# Patient Record
Sex: Female | Born: 1970 | Race: White | Hispanic: No | Marital: Married | Smoking: Former smoker
Health system: Southern US, Community
[De-identification: ages and names within clinical notes are randomized; demographics above are authoritative.]

## PROBLEM LIST (undated history)

## (undated) DIAGNOSIS — Z87891 Personal history of nicotine dependence: Secondary | ICD-10-CM

## (undated) DIAGNOSIS — K219 Gastro-esophageal reflux disease without esophagitis: Secondary | ICD-10-CM

## (undated) DIAGNOSIS — M1990 Unspecified osteoarthritis, unspecified site: Secondary | ICD-10-CM

## (undated) DIAGNOSIS — R112 Nausea with vomiting, unspecified: Secondary | ICD-10-CM

## (undated) DIAGNOSIS — E785 Hyperlipidemia, unspecified: Secondary | ICD-10-CM

## (undated) HISTORY — PX: ABDOMINOPLASTY: SUR9

## (undated) HISTORY — PX: ANTERIOR CRUCIATE LIGAMENT REPAIR: SHX115

## (undated) HISTORY — PX: BREAST ENHANCEMENT SURGERY: SHX7

## (undated) HISTORY — PX: TUBAL LIGATION: SHX77

## (undated) HISTORY — PX: ROTATOR CUFF REPAIR: SHX139

## (undated) HISTORY — PX: ABDOMINAL HYSTERECTOMY: SHX81

---

## 2009-02-09 ENCOUNTER — Ambulatory Visit (HOSPITAL_BASED_OUTPATIENT_CLINIC_OR_DEPARTMENT_OTHER): Admission: RE | Admit: 2009-02-09 | Discharge: 2009-02-10 | Payer: Self-pay | Admitting: Gynecology

## 2009-04-27 ENCOUNTER — Encounter: Admission: RE | Admit: 2009-04-27 | Discharge: 2009-04-27 | Payer: Self-pay | Admitting: Gynecology

## 2010-01-05 ENCOUNTER — Encounter: Admission: RE | Admit: 2010-01-05 | Discharge: 2010-01-05 | Payer: Self-pay | Admitting: Gynecology

## 2010-08-29 ENCOUNTER — Encounter (INDEPENDENT_AMBULATORY_CARE_PROVIDER_SITE_OTHER): Payer: Self-pay | Admitting: Emergency Medicine

## 2010-08-29 ENCOUNTER — Ambulatory Visit: Payer: Self-pay | Admitting: Vascular Surgery

## 2010-08-29 ENCOUNTER — Emergency Department (HOSPITAL_COMMUNITY): Admission: EM | Admit: 2010-08-29 | Discharge: 2010-08-29 | Payer: Self-pay | Admitting: Emergency Medicine

## 2010-08-29 ENCOUNTER — Ambulatory Visit: Admission: RE | Admit: 2010-08-29 | Discharge: 2010-08-29 | Payer: Self-pay | Admitting: Emergency Medicine

## 2011-01-27 ENCOUNTER — Other Ambulatory Visit: Payer: Self-pay | Admitting: Gynecology

## 2011-01-27 DIAGNOSIS — Z1231 Encounter for screening mammogram for malignant neoplasm of breast: Secondary | ICD-10-CM

## 2011-01-31 ENCOUNTER — Ambulatory Visit
Admission: RE | Admit: 2011-01-31 | Discharge: 2011-01-31 | Disposition: A | Discharge status: Home / Self Care | Payer: BC Managed Care – PPO | Source: Ambulatory Visit | Attending: Gynecology | Admitting: Gynecology

## 2011-01-31 DIAGNOSIS — Z1231 Encounter for screening mammogram for malignant neoplasm of breast: Secondary | ICD-10-CM

## 2011-02-23 LAB — POCT HEMOGLOBIN-HEMACUE: Hemoglobin: 14.7 g/dL (ref 12.0–15.0)

## 2011-03-28 NOTE — Op Note (Signed)
Deanna Gregory, Deanna Gregory                 ACCOUNT NO.:  [id number]   MEDICAL RECORD NO.:  [id number]          PATIENT TYPE:  AMB   LOCATION:  [facility]                         FACILITY:  [facility]   PHYSICIAN:  Gretta Cool, M.D. DATE OF BIRTH:  03/30/1971   DATE OF PROCEDURE:  02/08/2009  DATE OF DISCHARGE:                               OPERATIVE REPORT   PREOPERATIVE DIAGNOSES:  Severe pelvic organ prolapse, grade 3, with  rectocele, enterocele and severe fascial detachment, __________descent.   PROCEDURE:  Posterior and enterocele repairs, cardinal uterosacral  colposuspension.   SURGEON:  Dr. Beather Arbour.   ASSISTANT:  Dr. Ilda Mori.   ANESTHESIA:  General orotracheal.   DESCRIPTION OF PROCEDURE:  Under excellent anesthesia as above with the  patient prepped and draped in lithotomy position in [place] stirrups with  bladder drained by Foley catheter, the introitus was grasped with Kelly  clamps and an incision was made in the posterior perineal body and the  mucosa and skin excised at the introitus.  The mucosa was then  undermined and dissected to the apex of the vaginal cuff.  The cut edges  were grasped with Allis clamps.  The mucosa was then dissected from the  perirectal fascia.  At this point, the large enterocele was identified.  The cardinal uterosacral complex was also identified on each side and  grasped as far posteriorly as possible.  The posterior vaginal fascia  was then secured from its detached position down to the lower third of  the vagina and pulled all the way back up to the cardinal uterosacral  complex.  It was attached with no intervening fat or other tissues.  A 2-  0 Novofil suture was used.  At this point, the fascial defect was  completely repaired except for the central area.  The central fascia was  then secured to the uterosacral cardinal complex remnants laterally and  to the anterior vaginal wall and then to the posterior fascia at the  apex of the  vaginal cuff so as to incorporate a complete envelope of  fascia.  The fascia was then plicated transversely so as to completely  repair the perirectal fascia, small fine defects and weaknesses.  At  this point, the fascia was repaired from the apex of the cuff to the  introitus.  The perineal body muscles were then individually plicated in  the midline so as to rebuild the perineal body.  At this point, the  mucosa was trimmed and the upper layers of endopelvic fascia and mucosa  were closed with a running suture of 2-0 Vicryl.  At this point, the  procedure was terminated without complication.  The patient returned to  the recovery room in excellent condition.  No packs.  Blood loss  estimated at less than 100 mL.  Complications none.           ______________________________  Gretta Cool, M.D.    CWL/MEDQ  D:  02/09/2009  T:  02/09/2009  Job:  [id number]   cc:   Warrick Parisian, MD  [facility]

## 2012-03-22 ENCOUNTER — Other Ambulatory Visit: Payer: Self-pay | Admitting: Family Medicine

## 2012-03-22 DIAGNOSIS — Z1231 Encounter for screening mammogram for malignant neoplasm of breast: Secondary | ICD-10-CM

## 2012-04-05 ENCOUNTER — Ambulatory Visit: Payer: BC Managed Care – PPO

## 2012-07-02 ENCOUNTER — Ambulatory Visit
Admission: RE | Admit: 2012-07-02 | Discharge: 2012-07-02 | Disposition: A | Discharge status: Home / Self Care | Source: Ambulatory Visit | Attending: Family Medicine | Admitting: Family Medicine

## 2012-07-02 DIAGNOSIS — Z1231 Encounter for screening mammogram for malignant neoplasm of breast: Secondary | ICD-10-CM

## 2013-03-14 ENCOUNTER — Ambulatory Visit (HOSPITAL_COMMUNITY)
Admission: RE | Admit: 2013-03-14 | Discharge: 2013-03-14 | Disposition: A | Discharge status: Home / Self Care | Source: Ambulatory Visit | Attending: Family Medicine | Admitting: Family Medicine

## 2013-03-14 ENCOUNTER — Other Ambulatory Visit (HOSPITAL_COMMUNITY): Payer: Self-pay | Admitting: Family Medicine

## 2013-03-14 DIAGNOSIS — R0989 Other specified symptoms and signs involving the circulatory and respiratory systems: Secondary | ICD-10-CM | POA: Insufficient documentation

## 2013-03-14 DIAGNOSIS — R05 Cough: Secondary | ICD-10-CM

## 2013-03-14 DIAGNOSIS — R059 Cough, unspecified: Secondary | ICD-10-CM | POA: Insufficient documentation

## 2015-08-12 ENCOUNTER — Other Ambulatory Visit (HOSPITAL_COMMUNITY): Payer: Self-pay | Admitting: Family Medicine

## 2015-08-12 ENCOUNTER — Ambulatory Visit (HOSPITAL_COMMUNITY)
Admission: RE | Admit: 2015-08-12 | Discharge: 2015-08-12 | Disposition: A | Discharge status: Home / Self Care | Source: Ambulatory Visit | Attending: Family Medicine | Admitting: Family Medicine

## 2015-08-12 DIAGNOSIS — R52 Pain, unspecified: Secondary | ICD-10-CM

## 2015-08-12 DIAGNOSIS — M79671 Pain in right foot: Secondary | ICD-10-CM | POA: Insufficient documentation

## 2015-08-12 DIAGNOSIS — M79672 Pain in left foot: Secondary | ICD-10-CM | POA: Diagnosis not present

## 2017-03-10 IMAGING — CR DG FOOT COMPLETE 3+V*R*
3 series · 3 of 3 positions shown · non-contrast
Comparison: None.

CLINICAL DATA: Bilateral foot pain with burning in the first MTP
joints for 9 months. No acute injury. Initial encounter.

EXAM:
RIGHT FOOT COMPLETE - 3+ VIEW

[foot ap]
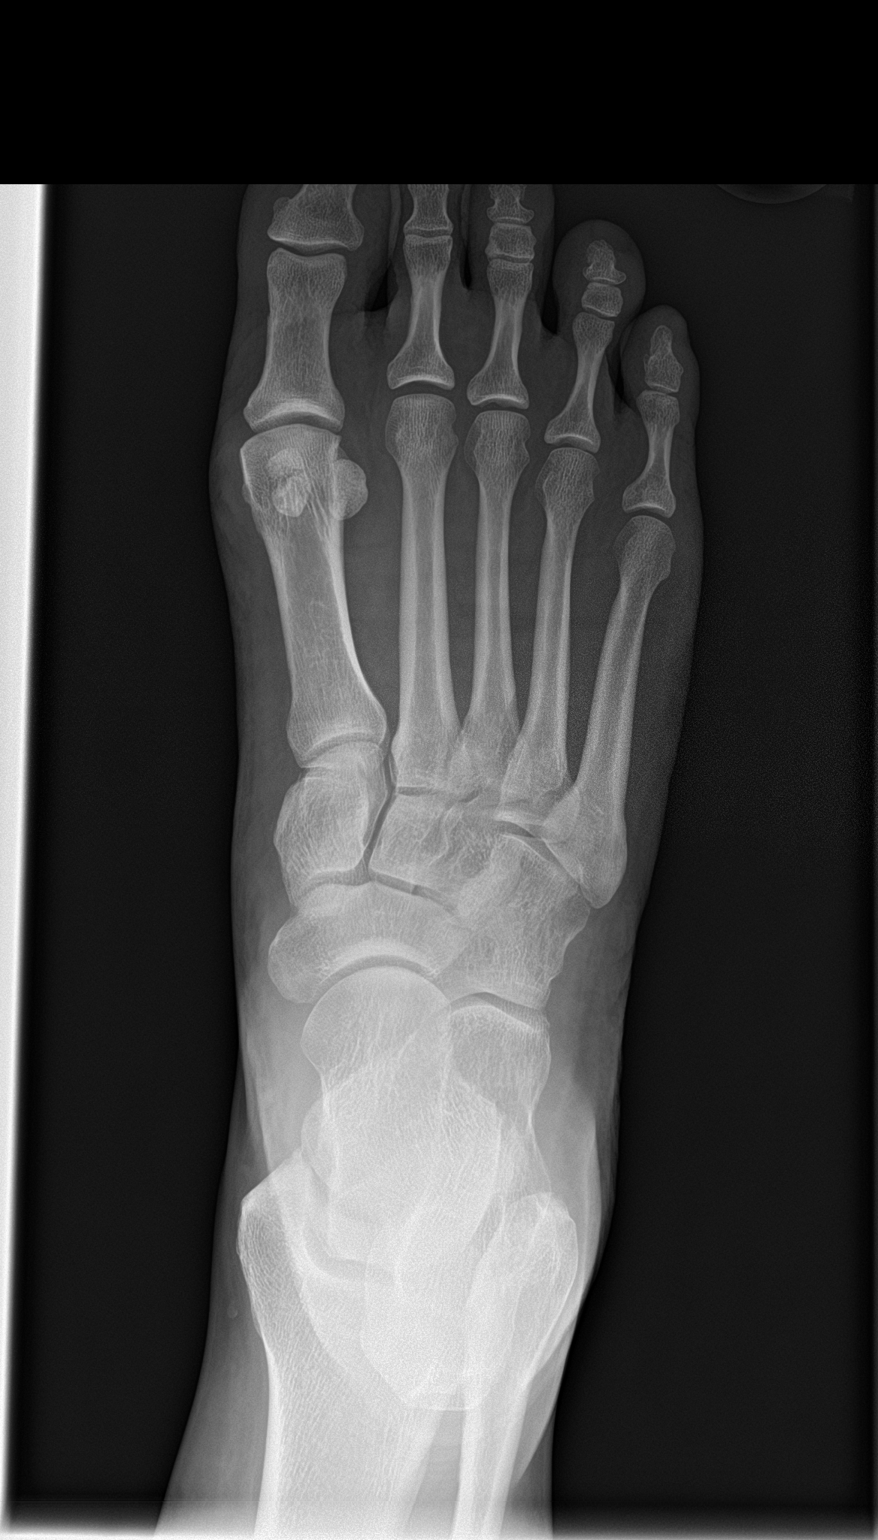

[foot obl]
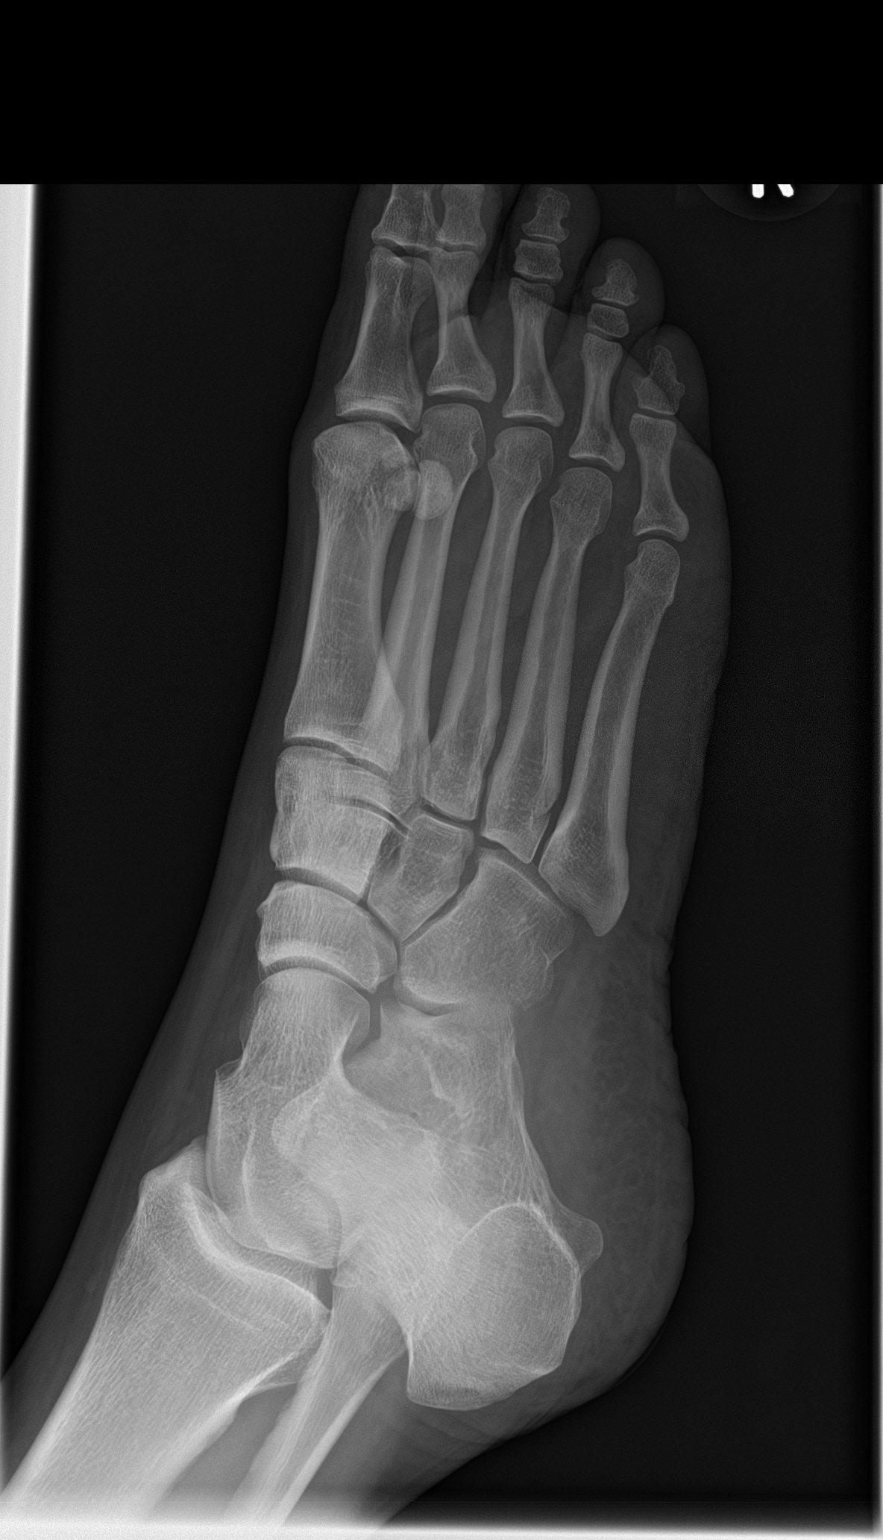

[foot lat]
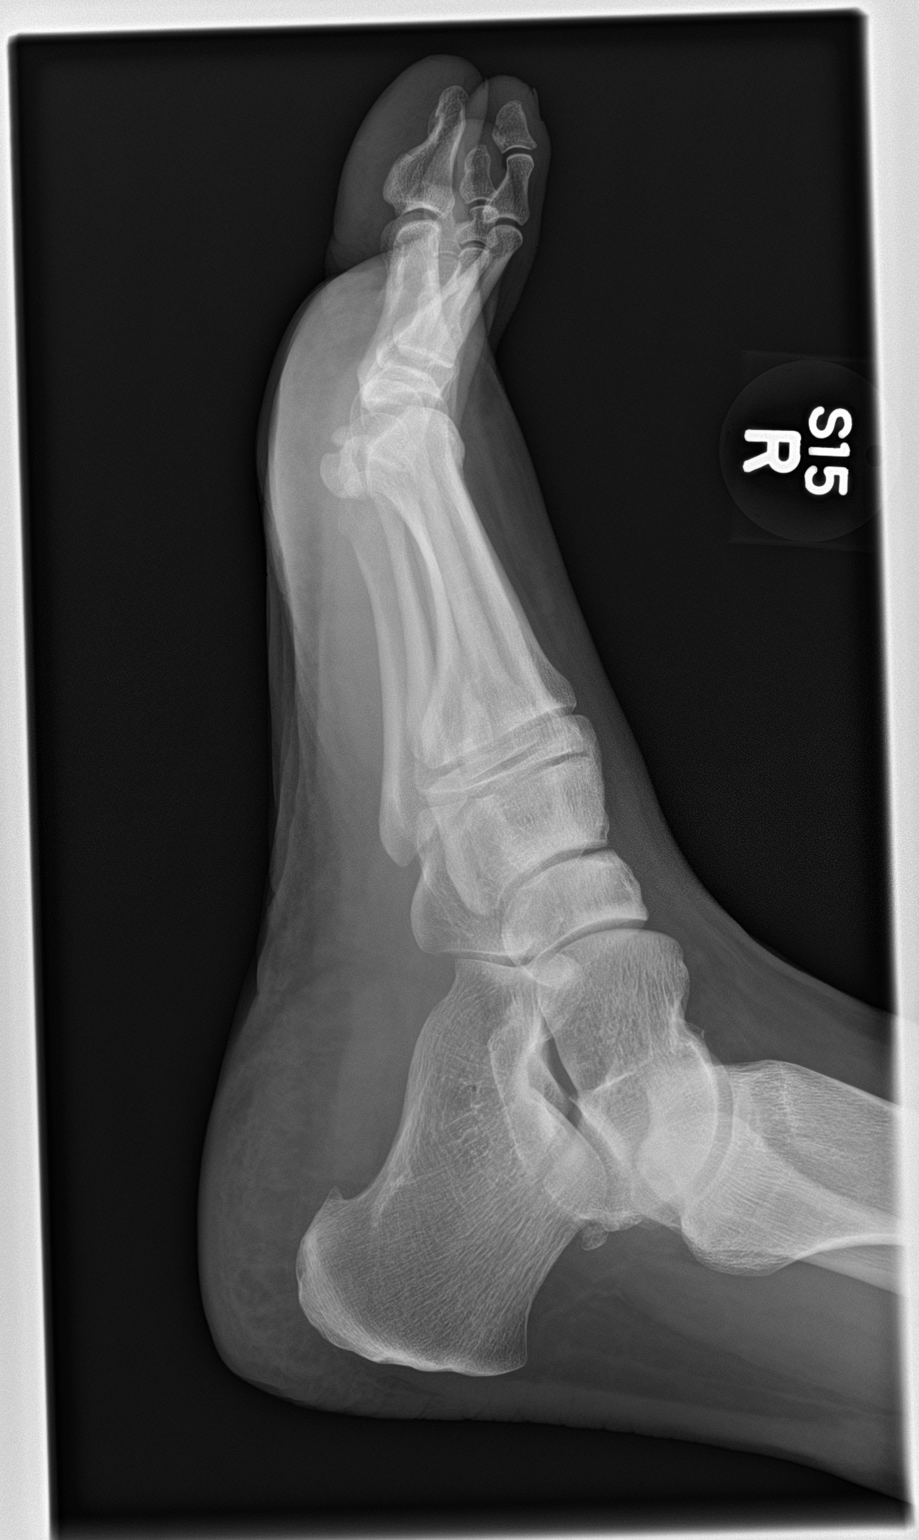

[3 of 3 positions shown; findings below may reference images not displayed]

FINDINGS: The mineralization and alignment are normal. There is no evidence of
acute fracture or dislocation. There is minimal joint space loss at
the first MTP joint. The tibial sesamoid of the first metatarsal is
bipartite. No other significant arthropathic changes.
IMPRESSION: No acute osseous findings. Minimal degenerative changes at the first
MTP joint.

## 2017-03-10 IMAGING — CR DG FOOT COMPLETE 3+V*L*
3 series · 3 of 3 positions shown · non-contrast
Comparison: None

CLINICAL DATA: BILATERAL foot pain and burning in first MTP joints
and at balls of feet since changing jobs 9 months ago, went from
sitting to standing/walking

EXAM:
LEFT FOOT - COMPLETE 3+ VIEW

[foot ap]
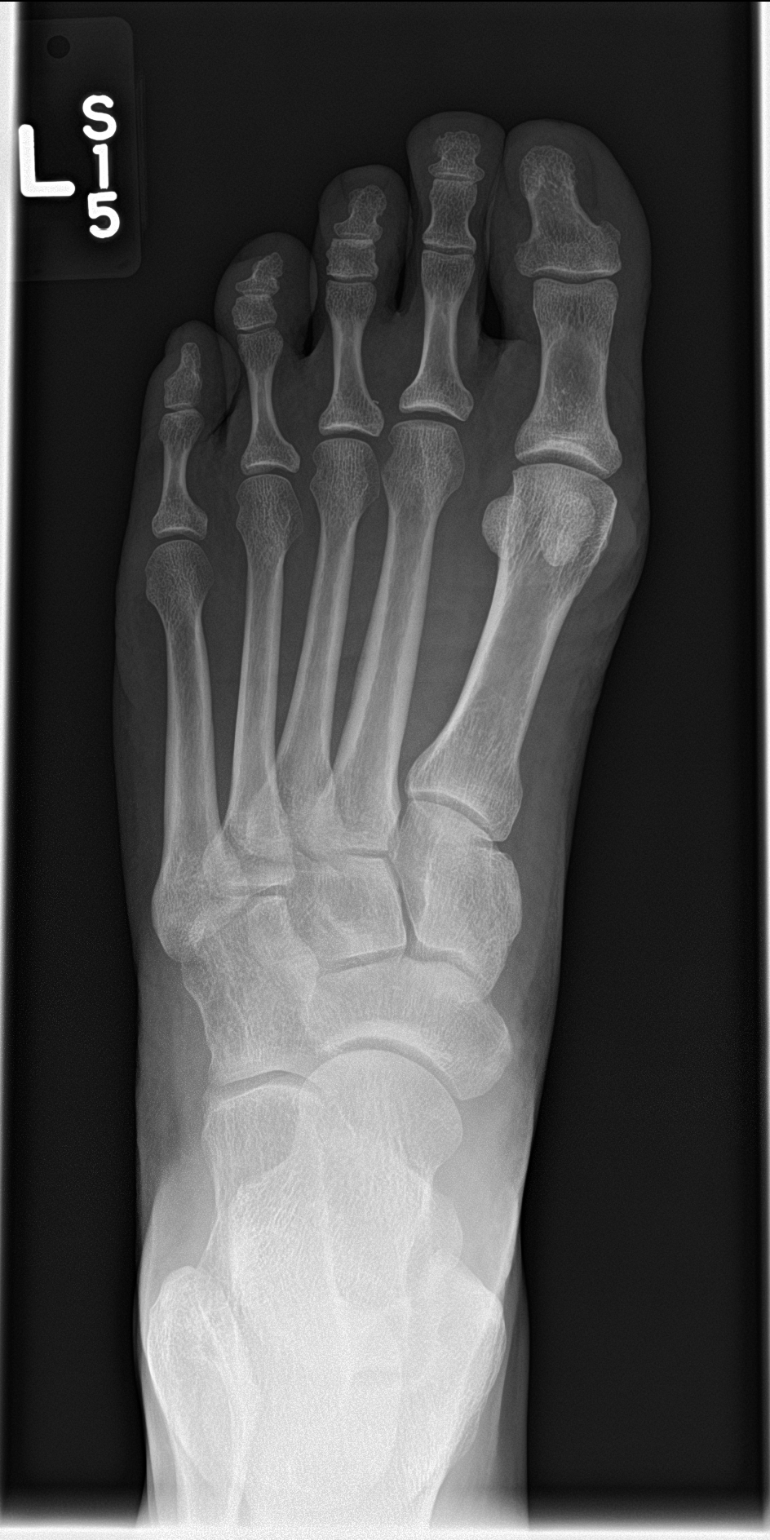

[foot obl]
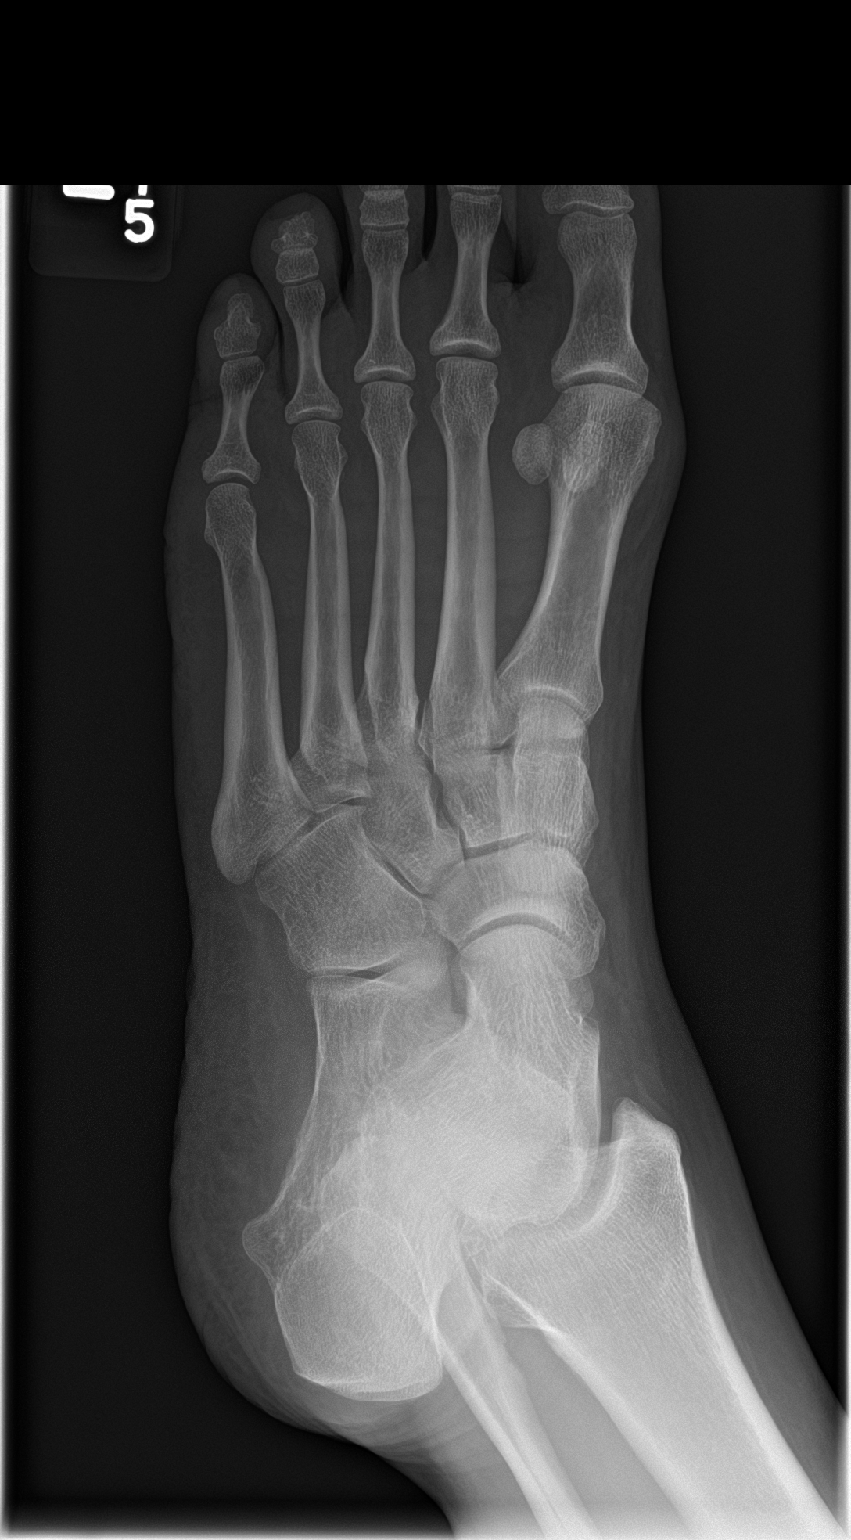

[foot lat]
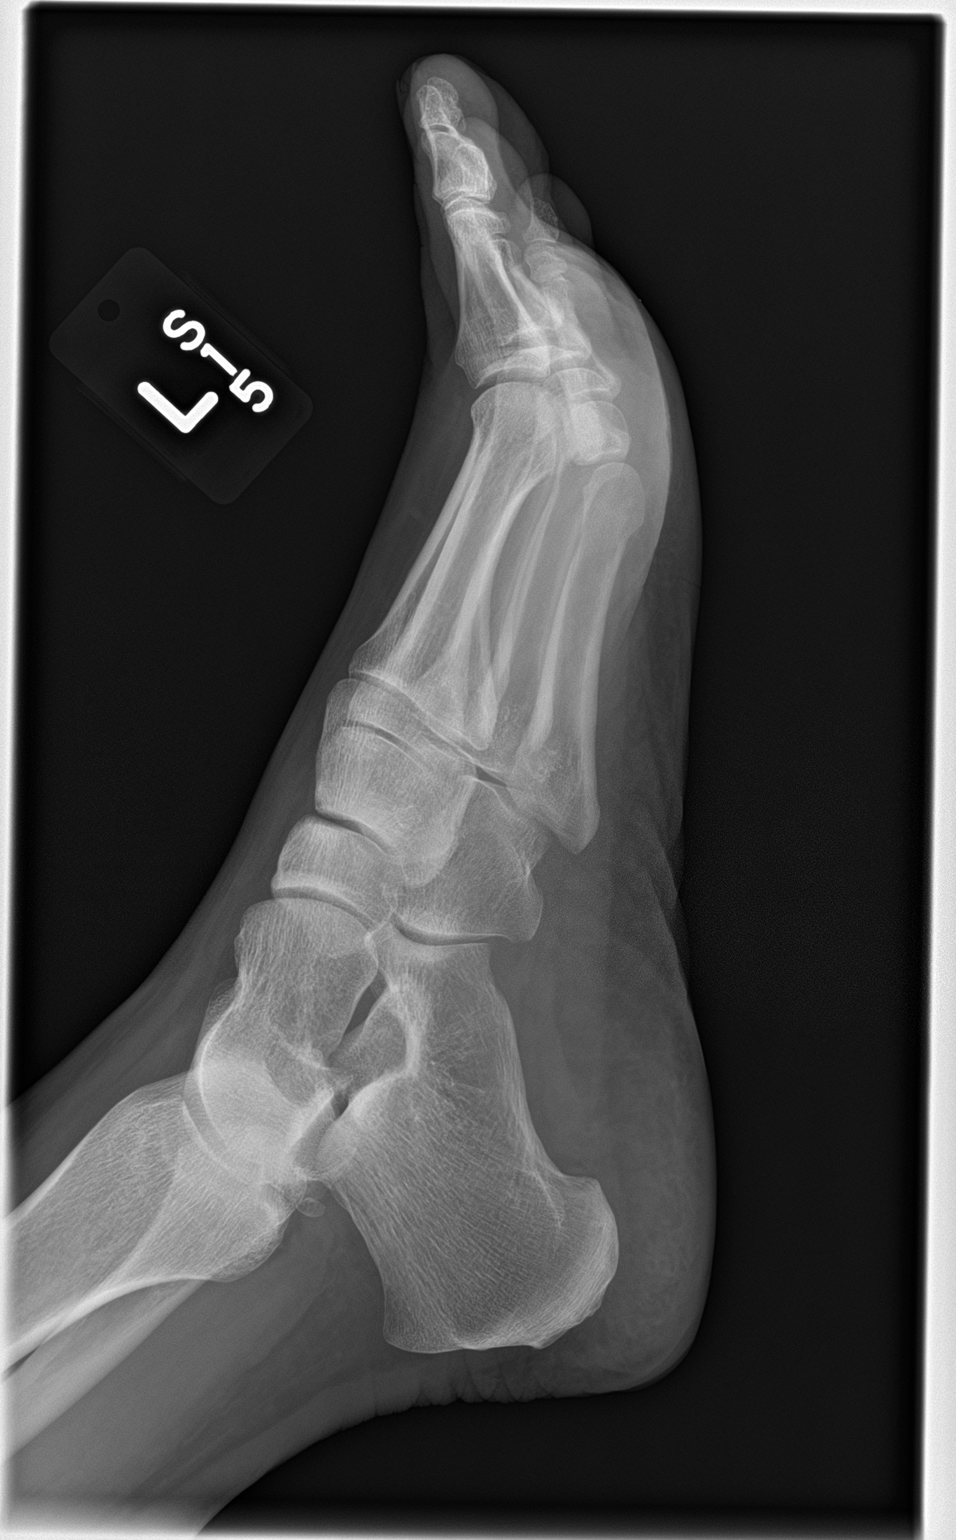

[3 of 3 positions shown; findings below may reference images not displayed]

FINDINGS: Osseous mineralization normal.

Joint spaces preserved.

No fracture, dislocation, or bone destruction.
IMPRESSION: Normal exam.

## 2017-11-01 ENCOUNTER — Other Ambulatory Visit: Payer: Self-pay | Admitting: Family Medicine

## 2017-11-01 DIAGNOSIS — R51 Headache: Principal | ICD-10-CM

## 2017-11-01 DIAGNOSIS — R519 Headache, unspecified: Secondary | ICD-10-CM

## 2017-11-12 ENCOUNTER — Ambulatory Visit

## 2018-02-26 DIAGNOSIS — R079 Chest pain, unspecified: Secondary | ICD-10-CM | POA: Diagnosis not present

## 2021-11-30 ENCOUNTER — Other Ambulatory Visit: Payer: Self-pay

## 2021-11-30 ENCOUNTER — Encounter (HOSPITAL_BASED_OUTPATIENT_CLINIC_OR_DEPARTMENT_OTHER): Payer: Self-pay | Admitting: Orthopedic Surgery

## 2021-12-06 NOTE — H&P (Signed)
° °  Deanna Gregory is an 51 y.o. female.   Chief Complaint: RIGHT WRIST PAIN  HPI: the patient is a 51 year old right-hand dominant female who works as a Catering manager.  She has had several months of pain in her wrists that has also caused weakness and catching.  She has not seen any relief with conservative measures.  MRI was done September 2022 and a wrist joint injection done September 2022.  Due to the continuation of pain, we discussed surgical intervention. She is here today for surgery. She denies chest pain, shortness of breath, fever, chills, nausea, vomiting, diarrhea.  Past Medical History:  Diagnosis Date   Arthritis    wrists, feet   Former smoker    GERD (gastroesophageal reflux disease)    Hyperlipidemia    PONV (postoperative nausea and vomiting)     Past Surgical History:  Procedure Laterality Date   ABDOMINAL HYSTERECTOMY     ABDOMINOPLASTY     ANTERIOR CRUCIATE LIGAMENT REPAIR Left    BREAST ENHANCEMENT SURGERY     ROTATOR CUFF REPAIR Bilateral    TUBAL LIGATION      History reviewed. No pertinent family history. Social History:  reports that she has quit smoking. Her smoking use included cigarettes. She has never used smokeless tobacco. She reports current alcohol use. She reports that she does not use drugs.  Allergies: No Known Allergies  No medications prior to admission.    No results found for this or any previous visit (from the past 48 hour(s)). No results found.  ROS NO RECENT ILLNESSES OR HOSPITALIZATIONS  Height 5\' 6"  (1.676 m), weight 83.5 kg. Physical Exam  General Appearance:  Alert, cooperative, no distress, appears stated age  Head:  Normocephalic, without obvious abnormality, atraumatic  Eyes:  Pupils equal, conjunctiva/corneas clear,         Throat: Lips, mucosa, and tongue normal; teeth and gums normal  Neck: No visible masses     Lungs:   respirations unlabored  Chest Wall:  No tenderness or deformity  Heart:  Regular rate and  rhythm,  Abdomen:   Soft, non-tender,         Extremities: RUE: SKIN INTACT, FINGER WARM WELL PERFUSED GOOD DIGITAL PERFUSION  Pulses: 2+ and symmetric  Skin: Skin color, texture, turgor normal, no rashes or lesions     Neurologic: Normal     Assessment/Plan RIGHT WRIST TFCC TEAR      - RIGHT WRIST ARTHROSCOPY AND JOINT DEBRIDEMENT AND OPEN REPAIR AS INDICATED  R/B/A DISCUSSED WITH PT IN OFFICE.  PT VOICED UNDERSTANDING OF PLAN CONSENT SIGNED DAY OF SURGERY PT SEEN AND EXAMINED PRIOR TO OPERATIVE PROCEDURE/DAY OF SURGERY SITE MARKED. QUESTIONS ANSWERED WILL GO HOME FOLLOWING SURGERY   WE ARE PLANNING SURGERY FOR YOUR UPPER EXTREMITY. THE RISKS AND BENEFITS OF SURGERY INCLUDE BUT NOT LIMITED TO BLEEDING INFECTION, DAMAGE TO NEARBY NERVES ARTERIES TENDONS, FAILURE OF SURGERY TO ACCOMPLISH ITS INTENDED GOALS, PERSISTENT SYMPTOMS AND NEED FOR FURTHER SURGICAL INTERVENTION. WITH THIS IN MIND WE WILL PROCEED. I HAVE DISCUSSED WITH THE PATIENT THE PRE AND POSTOPERATIVE REGIMEN AND THE DOS AND DON'TS. PT VOICED UNDERSTANDING AND INFORMED CONSENT SIGNED.   Iran Planas MD   [name] 12/06/2021, 2:25 PM

## 2021-12-12 ENCOUNTER — Ambulatory Visit (HOSPITAL_BASED_OUTPATIENT_CLINIC_OR_DEPARTMENT_OTHER): Admitting: Anesthesiology

## 2021-12-12 ENCOUNTER — Encounter (HOSPITAL_BASED_OUTPATIENT_CLINIC_OR_DEPARTMENT_OTHER)
Admission: RE | Disposition: A | Discharge status: Home / Self Care | Payer: Self-pay | Source: Home / Self Care | Attending: Orthopedic Surgery

## 2021-12-12 ENCOUNTER — Encounter (HOSPITAL_BASED_OUTPATIENT_CLINIC_OR_DEPARTMENT_OTHER): Payer: Self-pay | Admitting: Orthopedic Surgery

## 2021-12-12 ENCOUNTER — Ambulatory Visit (HOSPITAL_BASED_OUTPATIENT_CLINIC_OR_DEPARTMENT_OTHER)
Admission: RE | Admit: 2021-12-12 | Discharge: 2021-12-12 | Disposition: A | Discharge status: Home / Self Care | Attending: Orthopedic Surgery | Admitting: Orthopedic Surgery

## 2021-12-12 ENCOUNTER — Other Ambulatory Visit: Payer: Self-pay

## 2021-12-12 DIAGNOSIS — X58XXXA Exposure to other specified factors, initial encounter: Secondary | ICD-10-CM | POA: Diagnosis not present

## 2021-12-12 DIAGNOSIS — K219 Gastro-esophageal reflux disease without esophagitis: Secondary | ICD-10-CM | POA: Insufficient documentation

## 2021-12-12 DIAGNOSIS — M25531 Pain in right wrist: Secondary | ICD-10-CM | POA: Diagnosis present

## 2021-12-12 DIAGNOSIS — S63501A Unspecified sprain of right wrist, initial encounter: Secondary | ICD-10-CM

## 2021-12-12 DIAGNOSIS — Z87891 Personal history of nicotine dependence: Secondary | ICD-10-CM | POA: Diagnosis not present

## 2021-12-12 DIAGNOSIS — E785 Hyperlipidemia, unspecified: Secondary | ICD-10-CM | POA: Insufficient documentation

## 2021-12-12 DIAGNOSIS — S63591A Other specified sprain of right wrist, initial encounter: Secondary | ICD-10-CM | POA: Diagnosis not present

## 2021-12-12 HISTORY — DX: Unspecified osteoarthritis, unspecified site: M19.90

## 2021-12-12 HISTORY — DX: Hyperlipidemia, unspecified: E78.5

## 2021-12-12 HISTORY — DX: Nausea with vomiting, unspecified: R11.2

## 2021-12-12 HISTORY — PX: WRIST ARTHROSCOPY: SHX838

## 2021-12-12 HISTORY — DX: Gastro-esophageal reflux disease without esophagitis: K21.9

## 2021-12-12 HISTORY — DX: Personal history of nicotine dependence: Z87.891

## 2021-12-12 LAB — NO BLOOD PRODUCTS

## 2021-12-12 SURGERY — ARTHROSCOPY, WRIST
Anesthesia: Regional | Site: Wrist | Laterality: Right

## 2021-12-12 MED ORDER — FENTANYL CITRATE (PF) 100 MCG/2ML IJ SOLN
INTRAMUSCULAR | Status: DC | PRN
  Administered 2021-12-12 (×2): 25 ug via INTRAVENOUS
  Administered 2021-12-12: 50 ug via INTRAVENOUS

## 2021-12-12 MED ORDER — SCOPOLAMINE 1 MG/3DAYS TD PT72
1.0000 | MEDICATED_PATCH | TRANSDERMAL | Status: DC
  Administered 2021-12-12: 1.5 mg via TRANSDERMAL

## 2021-12-12 MED ORDER — MIDAZOLAM HCL 5 MG/5ML IJ SOLN
INTRAMUSCULAR | Status: DC | PRN
  Administered 2021-12-12: 1 mg via INTRAVENOUS

## 2021-12-12 MED ORDER — ACETAMINOPHEN 325 MG PO TABS: 325.0000 mg | ORAL_TABLET | ORAL | Status: DC | PRN

## 2021-12-12 MED ORDER — SODIUM CHLORIDE (PF) 0.9 % IJ SOLN
INTRAMUSCULAR | Status: DC | PRN
Start: 2021-12-12 — End: 2021-12-12
  Administered 2021-12-12: 400 mL

## 2021-12-12 MED ORDER — MIDAZOLAM HCL 2 MG/2ML IJ SOLN
INTRAMUSCULAR | Status: AC
  Filled 2021-12-12: qty 2

## 2021-12-12 MED ORDER — AMISULPRIDE (ANTIEMETIC) 5 MG/2ML IV SOLN: 10.0000 mg | Freq: Once | INTRAVENOUS | Status: DC | PRN

## 2021-12-12 MED ORDER — OXYCODONE HCL 5 MG PO TABS: 5.0000 mg | ORAL_TABLET | Freq: Once | ORAL | Status: DC | PRN

## 2021-12-12 MED ORDER — FENTANYL CITRATE (PF) 100 MCG/2ML IJ SOLN
INTRAMUSCULAR | Status: AC
  Filled 2021-12-12: qty 2

## 2021-12-12 MED ORDER — PROPOFOL 500 MG/50ML IV EMUL
INTRAVENOUS | Status: DC | PRN
  Administered 2021-12-12: 150 ug/kg/min via INTRAVENOUS

## 2021-12-12 MED ORDER — PROMETHAZINE HCL 25 MG/ML IJ SOLN: 6.2500 mg | INTRAMUSCULAR | Status: DC | PRN

## 2021-12-12 MED ORDER — CEFAZOLIN SODIUM-DEXTROSE 2-4 GM/100ML-% IV SOLN
2.0000 g | INTRAVENOUS | Status: AC
  Administered 2021-12-12: 2 g via INTRAVENOUS

## 2021-12-12 MED ORDER — ACETAMINOPHEN 160 MG/5ML PO SOLN: 325.0000 mg | ORAL | Status: DC | PRN

## 2021-12-12 MED ORDER — BUPIVACAINE-EPINEPHRINE (PF) 0.5% -1:200000 IJ SOLN
INTRAMUSCULAR | Status: DC | PRN
  Administered 2021-12-12: 30 mL via PERINEURAL

## 2021-12-12 MED ORDER — CEFAZOLIN SODIUM-DEXTROSE 2-4 GM/100ML-% IV SOLN
INTRAVENOUS | Status: AC
  Filled 2021-12-12: qty 100

## 2021-12-12 MED ORDER — LACTATED RINGERS IV SOLN: INTRAVENOUS | Status: DC

## 2021-12-12 MED ORDER — MIDAZOLAM HCL 2 MG/2ML IJ SOLN
2.0000 mg | Freq: Once | INTRAMUSCULAR | Status: AC
  Administered 2021-12-12: 2 mg via INTRAVENOUS

## 2021-12-12 MED ORDER — OXYCODONE HCL 5 MG/5ML PO SOLN: 5.0000 mg | Freq: Once | ORAL | Status: DC | PRN

## 2021-12-12 MED ORDER — ACETAMINOPHEN 10 MG/ML IV SOLN: 1000.0000 mg | Freq: Once | INTRAVENOUS | Status: DC | PRN

## 2021-12-12 MED ORDER — SCOPOLAMINE 1 MG/3DAYS TD PT72
MEDICATED_PATCH | TRANSDERMAL | Status: AC
  Filled 2021-12-12: qty 1

## 2021-12-12 MED ORDER — ONDANSETRON HCL 4 MG/2ML IJ SOLN
INTRAMUSCULAR | Status: AC
  Filled 2021-12-12: qty 2

## 2021-12-12 MED ORDER — ONDANSETRON HCL 4 MG/2ML IJ SOLN
INTRAMUSCULAR | Status: DC | PRN
  Administered 2021-12-12: 4 mg via INTRAVENOUS

## 2021-12-12 MED ORDER — 0.9 % SODIUM CHLORIDE (POUR BTL) OPTIME: TOPICAL | Status: DC | PRN

## 2021-12-12 MED ORDER — FENTANYL CITRATE (PF) 100 MCG/2ML IJ SOLN: 25.0000 ug | INTRAMUSCULAR | Status: DC | PRN

## 2021-12-12 MED ORDER — FENTANYL CITRATE (PF) 100 MCG/2ML IJ SOLN
50.0000 ug | Freq: Once | INTRAMUSCULAR | Status: AC
  Administered 2021-12-12: 50 ug via INTRAVENOUS

## 2021-12-12 SURGICAL SUPPLY — 86 items
ANCH SUT PUSHLCK MN 8X2.5 (Orthopedic Implant) ×1 IMPLANT
ANCHOR SUT PUSHLOCK 2.5 MINI (Orthopedic Implant) ×1 IMPLANT
BLADE SURG 11 STRL SS (BLADE) ×2 IMPLANT
BLADE SURG 15 STRL LF DISP TIS (BLADE) IMPLANT
BLADE SURG 15 STRL SS (BLADE) ×2
BNDG CMPR 5X3 CHSV STRCH STRL (GAUZE/BANDAGES/DRESSINGS)
BNDG CMPR 9X4 STRL LF SNTH (GAUZE/BANDAGES/DRESSINGS) ×1
BNDG COHESIVE 3X5 TAN ST LF (GAUZE/BANDAGES/DRESSINGS) IMPLANT
BNDG CONFORM 3 STRL LF (GAUZE/BANDAGES/DRESSINGS) ×2 IMPLANT
BNDG ELASTIC 3X5.8 VLCR STR LF (GAUZE/BANDAGES/DRESSINGS) ×4 IMPLANT
BNDG ELASTIC 4X5.8 VLCR STR LF (GAUZE/BANDAGES/DRESSINGS) IMPLANT
BNDG ESMARK 4X9 LF (GAUZE/BANDAGES/DRESSINGS) ×1 IMPLANT
BNDG GAUZE ELAST 4 BULKY (GAUZE/BANDAGES/DRESSINGS) ×2 IMPLANT
CORD BIPOLAR FORCEPS 12FT (ELECTRODE) IMPLANT
COVER BACK TABLE 60X90IN (DRAPES) ×2 IMPLANT
COVER MAYO STAND STRL (DRAPES) ×2 IMPLANT
CUFF TOURN SGL QUICK 18 NS (TOURNIQUET CUFF) ×1 IMPLANT
CUFF TOURN SGL QUICK 18X4 (TOURNIQUET CUFF) IMPLANT
DECANTER SPIKE VIAL GLASS SM (MISCELLANEOUS) IMPLANT
DRAPE EXTREMITY T 121X128X90 (DISPOSABLE) ×2 IMPLANT
DRAPE IMP U-DRAPE 54X76 (DRAPES) ×1 IMPLANT
DRAPE OEC MINIVIEW 54X84 (DRAPES) IMPLANT
DRAPE SURG 17X23 STRL (DRAPES) ×1 IMPLANT
DRAPE U-SHAPE 47X51 STRL (DRAPES) ×1 IMPLANT
DRSG EMULSION OIL 3X3 NADH (GAUZE/BANDAGES/DRESSINGS) IMPLANT
GAUZE 4X4 16PLY ~~LOC~~+RFID DBL (SPONGE) ×1 IMPLANT
GAUZE XEROFORM 1X8 LF (GAUZE/BANDAGES/DRESSINGS) IMPLANT
GLOVE SURG ENC MOIS LTX SZ6.5 (GLOVE) ×2 IMPLANT
GLOVE SURG ENC MOIS LTX SZ8 (GLOVE) ×2 IMPLANT
GLOVE SURG POLYISO LF SZ7 (GLOVE) ×1 IMPLANT
GLOVE SURG UNDER POLY LF SZ6.5 (GLOVE) ×2 IMPLANT
GLOVE SURG UNDER POLY LF SZ7 (GLOVE) ×2 IMPLANT
GLOVE SURG UNDER POLY LF SZ8.5 (GLOVE) ×2 IMPLANT
GOWN STRL REUS W/ TWL LRG LVL3 (GOWN DISPOSABLE) ×1 IMPLANT
GOWN STRL REUS W/TWL LRG LVL3 (GOWN DISPOSABLE) ×4
KIT MINI BIO ANCHOR DRILL (KITS) ×1 IMPLANT
MANIFOLD NEPTUNE II (INSTRUMENTS) ×1 IMPLANT
NDL HYPO 25X1 1.5 SAFETY (NEEDLE) IMPLANT
NDL MAYO 6 CRC TAPER PT (NEEDLE) IMPLANT
NDL SUT 6 .5 CRC .975X.05 MAYO (NEEDLE) IMPLANT
NEEDLE HYPO 22GX1.5 SAFETY (NEEDLE) ×4 IMPLANT
NEEDLE HYPO 25X1 1.5 SAFETY (NEEDLE) ×2 IMPLANT
NEEDLE MAYO 6 CRC TAPER PT (NEEDLE) IMPLANT
NEEDLE MAYO TAPER (NEEDLE)
NS IRRIG 1000ML POUR BTL (IV SOLUTION) IMPLANT
PACK BASIN DAY SURGERY FS (CUSTOM PROCEDURE TRAY) ×2 IMPLANT
PAD CAST 3X4 CTTN HI CHSV (CAST SUPPLIES) ×2 IMPLANT
PAD CAST 4YDX4 CTTN HI CHSV (CAST SUPPLIES) IMPLANT
PADDING CAST ABS 4INX4YD NS (CAST SUPPLIES)
PADDING CAST ABS COTTON 4X4 ST (CAST SUPPLIES) ×1 IMPLANT
PADDING CAST COTTON 3X4 STRL (CAST SUPPLIES) ×4
PADDING CAST COTTON 4X4 STRL (CAST SUPPLIES)
PASSER SUT SWANSON 36MM LOOP (INSTRUMENTS) IMPLANT
SET SM JOINT TUBING/CANN (CANNULA) ×2 IMPLANT
SHAVER DISSECTOR 3.0 (BURR) ×2 IMPLANT
SPLINT FAST PLASTER 5X30 (CAST SUPPLIES)
SPLINT FIBERGLASS 3X35 (CAST SUPPLIES) IMPLANT
SPLINT FIBERGLASS 4X30 (CAST SUPPLIES) IMPLANT
SPLINT PLASTER CAST FAST 5X30 (CAST SUPPLIES) IMPLANT
SPLINT PLASTER CAST XFAST 3X15 (CAST SUPPLIES) IMPLANT
SPLINT PLASTER XTRA FASTSET 3X (CAST SUPPLIES)
STOCKINETTE 4X48 STRL (DRAPES) ×2 IMPLANT
STOCKINETTE SYNTHETIC 3 UNSTER (CAST SUPPLIES) ×2 IMPLANT
STRIP CLOSURE SKIN 1/2X4 (GAUZE/BANDAGES/DRESSINGS) IMPLANT
SUCTION FRAZIER HANDLE 10FR (MISCELLANEOUS) ×1
SUCTION TUBE FRAZIER 10FR DISP (MISCELLANEOUS) IMPLANT
SUT ETHIBOND 3-0 V-5 (SUTURE) IMPLANT
SUT FIBERWIRE 2-0 18 17.9 3/8 (SUTURE) ×2
SUT MNCRL AB 3-0 PS2 18 (SUTURE) ×1 IMPLANT
SUT PDS AB 2-0 CT2 27 (SUTURE) IMPLANT
SUT PROLENE 3 0 PS 2 (SUTURE) IMPLANT
SUT PROLENE 4 0 PS 2 18 (SUTURE) ×2 IMPLANT
SUT VIC AB 2-0 CT3 27 (SUTURE) ×1 IMPLANT
SUT VIC AB 3-0 FS2 27 (SUTURE) IMPLANT
SUTURE FIBERWR 2-0 18 17.9 3/8 (SUTURE) IMPLANT
SYR BULB EAR ULCER 3OZ GRN STR (SYRINGE) IMPLANT
SYR CONTROL 10ML LL (SYRINGE) ×2 IMPLANT
TAPE SURG TRANSPORE 1 IN (GAUZE/BANDAGES/DRESSINGS) ×1 IMPLANT
TAPE SURGICAL TRANSPORE 1 IN (GAUZE/BANDAGES/DRESSINGS) ×1
TOWEL GREEN STERILE FF (TOWEL DISPOSABLE) ×4 IMPLANT
TRAP DIGIT (INSTRUMENTS) ×2 IMPLANT
TRAP FINGER LRG (INSTRUMENTS) IMPLANT
TUBE CONNECTING 20X1/4 (TUBING) ×1 IMPLANT
UNDERPAD 30X36 HEAVY ABSORB (UNDERPADS AND DIAPERS) ×2 IMPLANT
WAND 1.5 MICROBLATOR (SURGICAL WAND) ×1 IMPLANT
WATER STERILE IRR 1000ML POUR (IV SOLUTION) ×1 IMPLANT

## 2021-12-12 NOTE — Transfer of Care (Signed)
Immediate Anesthesia Transfer of Care Note  Patient: Deanna Gregory  Procedure(s) Performed: Right wrist arthroscopy and joint debridement and open repair (Right: Wrist)  Patient Location: PACU  Anesthesia Type:MAC and Regional  Level of Consciousness: awake, alert  and oriented  Airway & Oxygen Therapy: Patient Spontanous Breathing  Post-op Assessment: Report given to RN and Post -op Vital signs reviewed and stable  Post vital signs: Reviewed and stable  Last Vitals:  Vitals Value Taken Time  BP    Temp    Pulse 56 12/12/21 1612  Resp 11 12/12/21 1612  SpO2 91 % 12/12/21 1612  Vitals shown include unvalidated device data.  Last Pain:  Vitals:   12/12/21 1325  TempSrc:   PainSc: (Pended) 0-No pain      Patients Stated Pain Goal: (Pended) 3 (44/45/84 8350)  Complications: No notable events documented.

## 2021-12-12 NOTE — Discharge Instructions (Addendum)
KEEP BANDAGE CLEAN AND DRY CALL OFFICE FOR F/U APPT [phone] IN 2 WEEKS KEEP HAND ELEVATED ABOVE HEART OK TO APPLY ICE TO OPERATIVE AREA CONTACT OFFICE IF ANY WORSENING PAIN OR CONCERNS.    Post Anesthesia Home Care Instructions  Activity: Get plenty of rest for the remainder of the day. A responsible individual must stay with you for 24 hours following the procedure.  For the next 24 hours, DO NOT: -Drive a car -Paediatric nurse -Drink alcoholic beverages -Take any medication unless instructed by your physician -Make any legal decisions or sign important papers.  Meals: Start with liquid foods such as gelatin or soup. Progress to regular foods as tolerated. Avoid greasy, spicy, heavy foods. If nausea and/or vomiting occur, drink only clear liquids until the nausea and/or vomiting subsides. Call your physician if vomiting continues.  Special Instructions/Symptoms: Your throat may feel dry or sore from the anesthesia or the breathing tube placed in your throat during surgery. If this causes discomfort, gargle with warm salt water. The discomfort should disappear within 24 hours.  If you had a scopolamine patch placed behind your ear for the management of post- operative nausea and/or vomiting:  1. The medication in the patch is effective for 72 hours, after which it should be removed.  Wrap patch in a tissue and discard in the trash. Wash hands thoroughly with soap and water. 2. You may remove the patch earlier than 72 hours if you experience unpleasant side effects which may include dry mouth, dizziness or visual disturbances. 3. Avoid touching the patch. Wash your hands with soap and water after contact with the patch.      Regional Anesthesia Blocks  1. Numbness or the inability to move the "blocked" extremity may last from 3-48 hours after placement. The length of time depends on the medication injected and your individual response to the medication. If the numbness is not going  away after 48 hours, call your surgeon.  2. The extremity that is blocked will need to be protected until the numbness is gone and the  Strength has returned. Because you cannot feel it, you will need to take extra care to avoid injury. Because it may be weak, you may have difficulty moving it or using it. You may not know what position it is in without looking at it while the block is in effect.  3. For blocks in the legs and feet, returning to weight bearing and walking needs to be done carefully. You will need to wait until the numbness is entirely gone and the strength has returned. You should be able to move your leg and foot normally before you try and bear weight or walk. You will need someone to be with you when you first try to ensure you do not fall and possibly risk injury.  4. Bruising and tenderness at the needle site are common side effects and will resolve in a few days.  5. Persistent numbness or new problems with movement should be communicated to the surgeon or the [place] [phone] [place] [phone]).

## 2021-12-12 NOTE — Op Note (Signed)
PREOPERATIVE DIAGNOSIS: Right wrist peripheral TFCC tear  POSTOPERATIVE DIAGNOSIS: Same  ATTENDING SURGEON: Dr. Iran Planas who is scrubbed and present for the entire procedure  ASSISTANT SURGEON: Gertie Fey, [facility] who scrubbed in necessary for arthroscopic debridement and open TFCC repair closure and splinting in a timely fashion  ANESTHESIA: Regional with IV sedation  OPERATIVE PROCEDURE: Right wrist arthroscopic TFCC debridement and radiocarpal arthroscopy Right wrist open capsulorrhaphy, peripheral TFCC repair ligamentous repair  IMPLANTS: Arthrex 2.5 push lock anchor  EBL: Minimal  RADIOGRAPHIC INTERPRETATION: None  SURGICAL INDICATIONS: Patient is a right-hand-dominant female who had the persistent ulnar-sided wrist pain.  Patient elected undergo the above procedure.  The risks of surgery include but not limited to bleeding infection damage nearby nerves arteries or tendons loss of motion of the wrist and digits incomplete relief of symptoms and need for further surgical invention.  A signed informed consent was obtained on the day of surgery.  SURGICAL TECHNIQUE: The patient was prepped identified in the preoperative holding area marked apart a marker made on the right wrist indicate correct operative site.  The patient then brought back operating placed supine on the anesthesia table where the regional anesthetic was administered.  Preoperative antibiotics were given prior to skin incision.  A well-padded tourniquet placed on the right brachium and seal with the appropriate drape.  The right upper extremities then prepped and draped in normal sterile fashion.  A timeout was called the correct site was identified procedure then begun.  Attention was then turned to the right wrist the limb was then placed in the [employer] wrist arthroscopy tower.  Proximally 5 to 10 pounds of countertraction applied across the radiocarpal midcarpal joint.  10 cc of saline solution was then infiltrated  into the joint.  3 4 working portal was established.  The joint was entered into surgically.  Following this a 6R was then established and outflow was obtained with a 22-gauge needle.  The patient did have good continuity of the radial scaphoid capitate short and long radiolunate ligaments as well as the scapholunate interosseous ligament.  Further inspection was seen carried out ulnarly with the patient did have the fraying around the peripheral margins of the TFCC but also had the tear of the peripheral margin of the TFCC.  Probing was able to be done underneath the peripheral margins and this was unstable.  Following this further arthroscopic evaluation was then carried out and the patient did have some chondral grade 3 4 changes at the lunotriquetral ligamentous region.  This was a very small region of the lunate.  The small suction shaver was then introduced and debridement was then carried out around the peripheral fraying of the TFCC as an chondroplasty was also carried out of the loose cartilaginous fragments along the LT region.  Probing of the LT ligament was done was unable to pass the probe through the LT ligament region.  The instrumentation was then reversed and further inspection of the lunotriquetral ligament as well as peripheral margin the TFCC was then done.  Given the instability of probing of the TFCC open ligamentous repair was then undertaken.  The arthroscopic instrumentation was then removed.  A longitudinal incision made directly over the region between the ECU and FCU.  The tourniquet insufflated.  Dissection carried down through the skin and subcutaneous tissue.  The retinaculum was marked and incised longitudinally careful protection distally was done of the ulnar sensory nerve.  Following this the ECU tendon was carefully protected.  Capsulotomy was  then done in the TFCC region was then opened up and inspected.  The patient did have the peripheral tear well visualized.  Once this was  carried out 2-0 FiberWire suture was then placed in a horizontal mattress through the tear and then back out.  The 2 limbs of the suture were then tagged for later.  Proximally 1 cm proximal to the ulnar styloid appropriate drill bit was then used for the push lock anchor.  The limbs of the suture were then crossed across the ulnar styloid and then brought down nicely down to the bone and the construct was then completed by gently tapping the anchor in place to secure this very nicely.  Probing of the TFCC was then done through the capsulotomy felt to be of good stability.  Peripheral sutures were then placed 2-0 Vicryl suture along the margins of the periphery.  Following this the arthrotomy was then closed with 2-0 Vicryl suture.  Careful repair of the retinaculum was then done with 2-0 Vicryl suture.  The subcutaneous tissues were then closed with 3-0 Monocryl and skin closed with a running 4-0 Prolene.  The arthroscopic instrumentation was then placed back into the joint and final images and probing of the TFCC was then done following repair there was good visualization of the repair without any instability.  The joint was then irrigated.  The portal sites were then closed with simple Prolene sutures.  Steri-Strips were applied over the ulnar incision.  Sterile compressive bandage then applied.  The patient was placed in a well-padded sugar-tong splint taken recovery in good condition  POSTOPERATIVE PLAN: Patient be discharged to home.  See him back in the office in 2 weeks for wound check suture removal application of a long-arm cast for total of 4 weeks short arm cast for total of 2 additional weeks then begin a therapy regimen at the 6-week mark.  No radiographs at the first visit.  Begin working on gentle mobility strength and conditioning from the 6-week mark to approximate the 4 to 62-month mark.  Prognosis: The patient did have some mild chondral changes noted within the lunate and the TFCC tear  hopefully the repair will provide her with some stability and diminish her pain.  If ulnar-sided discomfort persists 1 may consider given the chondral changes noted within the lunate and offloading procedure on the ulnar aspect of the wrist.

## 2021-12-12 NOTE — Progress Notes (Signed)
Assisted Dr. Hollis with right, ultrasound guided, supraclavicular block. Side rails up, monitors on throughout procedure. See vital signs in flow sheet. Tolerated Procedure well. 

## 2021-12-12 NOTE — Anesthesia Procedure Notes (Signed)
Anesthesia Regional Block: Supraclavicular block   Pre-Anesthetic Checklist: , timeout performed,  Correct Patient, Correct Site, Correct Laterality,  Correct Procedure, Correct Position, site marked,  Risks and benefits discussed,  Surgical consent,  Pre-op evaluation,  At surgeon's request and post-op pain management  Laterality: Right  Prep: chloraprep       Needles:  Injection technique: Single-shot  Needle Type: Echogenic Stimulator Needle     Needle Length: 9cm  Needle Gauge: 21     Additional Needles:   Procedures:,,,, ultrasound used (permanent image in chart),,    Narrative:  Start time: 12/12/2021 1:10 PM End time: 12/12/2021 1:15 PM Injection made incrementally with aspirations every 5 mL.  Performed by: Personally  Anesthesiologist: Effie Berkshire, MD  Additional Notes: Patient tolerated the procedure well. Local anesthetic introduced in an incremental fashion under minimal resistance after negative aspirations. No paresthesias were elicited. After completion of the procedure, no acute issues were identified and patient continued to be monitored by RN.   Complained of cramping sensation in neck during injection only.

## 2021-12-12 NOTE — Anesthesia Preprocedure Evaluation (Addendum)
Anesthesia Evaluation  Patient identified by MRN, date of birth, ID band Patient awake    Reviewed: Allergy & Precautions, NPO status , Patient's Chart, lab work & pertinent test results  History of Anesthesia Complications (+) PONV and history of anesthetic complications  Airway Mallampati: I  TM Distance: >3 FB Neck ROM: Full    Dental  (+) Teeth Intact, Dental Advisory Given   Pulmonary former smoker,    breath sounds clear to auscultation       Cardiovascular negative cardio ROS   Rhythm:Regular Rate:Normal     Neuro/Psych negative neurological ROS  negative psych ROS   GI/Hepatic Neg liver ROS, GERD  ,  Endo/Other  negative endocrine ROS  Renal/GU negative Renal ROS     Musculoskeletal  (+) Arthritis ,   Abdominal Normal abdominal exam  (+)   Peds  Hematology negative hematology ROS (+)   Anesthesia Other Findings   Reproductive/Obstetrics                            Anesthesia Physical Anesthesia Plan  ASA: 2  Anesthesia Plan: Regional   Post-op Pain Management: Regional block   Induction: Intravenous  PONV Risk Score and Plan: 4 or greater and Ondansetron, Dexamethasone, Propofol infusion and Midazolam  Airway Management Planned: Natural Airway and Simple Face Mask  Additional Equipment: None  Intra-op Plan:   Post-operative Plan:   Informed Consent: I have reviewed the patients History and Physical, chart, labs and discussed the procedure including the risks, benefits and alternatives for the proposed anesthesia with the patient or authorized representative who has indicated his/her understanding and acceptance.       Plan Discussed with: CRNA  Anesthesia Plan Comments:        Anesthesia Quick Evaluation

## 2021-12-13 NOTE — Anesthesia Postprocedure Evaluation (Signed)
Anesthesia Post Note  Patient: Deanna Gregory  Procedure(s) Performed: Right wrist arthroscopy and joint debridement and open repair (Right: Wrist)     Patient location during evaluation: PACU Anesthesia Type: Regional Level of consciousness: awake and alert Pain management: pain level controlled Vital Signs Assessment: post-procedure vital signs reviewed and stable Respiratory status: spontaneous breathing, nonlabored ventilation, respiratory function stable and patient connected to nasal cannula oxygen Cardiovascular status: stable and blood pressure returned to baseline Postop Assessment: no apparent nausea or vomiting Anesthetic complications: no   No notable events documented.  Last Vitals:  Vitals:   12/12/21 1645 12/12/21 1715  BP: 116/83 120/67  Pulse: 61 64  Resp: 17 18  Temp:  (!) 36.4 C  SpO2: 94% 93%    Last Pain:  Vitals:   12/12/21 1715  TempSrc:   PainSc: 0-No pain                 Effie Berkshire

## 2021-12-14 ENCOUNTER — Encounter (HOSPITAL_BASED_OUTPATIENT_CLINIC_OR_DEPARTMENT_OTHER): Payer: Self-pay | Admitting: Orthopedic Surgery

## 2021-12-19 ENCOUNTER — Ambulatory Visit (INDEPENDENT_AMBULATORY_CARE_PROVIDER_SITE_OTHER): Admitting: Dermatology

## 2021-12-19 ENCOUNTER — Other Ambulatory Visit: Payer: Self-pay

## 2021-12-19 DIAGNOSIS — L821 Other seborrheic keratosis: Secondary | ICD-10-CM

## 2021-12-19 DIAGNOSIS — Z1283 Encounter for screening for malignant neoplasm of skin: Secondary | ICD-10-CM | POA: Diagnosis not present

## 2021-12-19 DIAGNOSIS — L918 Other hypertrophic disorders of the skin: Secondary | ICD-10-CM

## 2021-12-19 DIAGNOSIS — D235 Other benign neoplasm of skin of trunk: Secondary | ICD-10-CM

## 2021-12-19 DIAGNOSIS — D239 Other benign neoplasm of skin, unspecified: Secondary | ICD-10-CM

## 2022-01-07 ENCOUNTER — Encounter: Payer: Self-pay | Admitting: Dermatology

## 2022-01-07 NOTE — Progress Notes (Signed)
° °  New Patient   Subjective  Deanna Gregory is a 51 y.o. female who presents for the following: Annual Exam (Pt here for full body exam. Pt has a "knot" on her back as well a few other spots of concern ).  General skin examination Location:  Duration:  Quality:  Associated Signs/Symptoms: Modifying Factors:  Severity:  Timing: Context:    The following portions of the chart were reviewed this encounter and updated as appropriate:  Tobacco   Allergies   Meds   Problems   Med Hx   Surg Hx   Fam Hx       Objective  Well appearing patient in no apparent distress; mood and affect are within normal limits. Right Upper Back 4 mm firm pink dermal papule, typical dermoscopy  Left Hand - Posterior, Left Lower Leg - Posterior, Left Thigh - Anterior, Left Upper Arm - Posterior, Right Lower Leg - Posterior Textured tan-brown flattopped 5 to 8 mm papules  Scalp General skin hydration: No atypical pigmented lesions or nonmelanoma skin cancer  Right Inframammary Fold Pedunculated 2 mm flesh-colored papule    A full examination was performed including scalp, head, eyes, ears, nose, lips, neck, chest, axillae, abdomen, back, buttocks, bilateral upper extremities, bilateral lower extremities, hands, feet, fingers, toes, fingernails, and toenails. All findings within normal limits unless otherwise noted below.  Areas beneath undergarments not fully examined.   Assessment & Plan  Dermatofibroma Right Upper Back  Discussed elective excision but may leave if stable  Seborrheic keratosis (5) Left Hand - Posterior; Left Upper Arm - Posterior; Left Thigh - Anterior; Left Lower Leg - Posterior; Right Lower Leg - Posterior  Leave if stable  Encounter for screening for malignant neoplasm of skin Scalp  Annual skin examination, encouraged to self examine twice annually.  Continue ultraviolet protection.  Skin tag Right Inframammary Fold  May choose future removal

## 2022-02-24 ENCOUNTER — Encounter (HOSPITAL_BASED_OUTPATIENT_CLINIC_OR_DEPARTMENT_OTHER): Payer: Self-pay | Admitting: Orthopedic Surgery

## 2022-02-24 ENCOUNTER — Other Ambulatory Visit: Payer: Self-pay

## 2022-03-03 NOTE — H&P (Signed)
?  Deanna Gregory is an 51 y.o. female.   ?Chief Complaint: LEFT WRIST PAIN ? ?HPI: She is a 51 y/o right hand dominant female who has had pain in both wrists that has been present for almost a year. She has failed treatment with injections, braces, medication, ice, and activity modification. 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 She has recently undergone surgery for the right wrist TFCC tear.  ?She is here today for surgery.  ?She denies chest pain, shortness of breath, fever, chills, nausea, or vomiting.  ? ? ?Past Medical History:  ?Diagnosis Date  ? 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 Former smoker   ? GERD (gastroesophageal reflux disease)   ? Hyperlipidemia   ? PONV (postoperative nausea and vomiting)   ? ? ?Past Surgical History:  ?Procedure Laterality Date  ? ABDOMINAL HYSTERECTOMY    ? ABDOMINOPLASTY    ? ANTERIOR CRUCIATE LIGAMENT REPAIR Left   ? BREAST ENHANCEMENT SURGERY    ? ROTATOR CUFF REPAIR Bilateral   ? TUBAL LIGATION    ? WRIST ARTHROSCOPY Right 12/12/2021  ? Procedure: Right wrist arthroscopy and joint debridement and open repair;  Surgeon: Iran Planas, MD;  Location: [facility];  Service: Orthopedics;  Laterality: Right;  with IV sedation  ? ? ?History reviewed. No pertinent family history. ?Social History:  reports that she has quit smoking. Her smoking use included cigarettes. She has never used smokeless tobacco. She reports current alcohol use. She reports that she does not use drugs. ? ?Allergies:  ?Allergies  ?Allergen Reactions  ? Codeine Itching and Nausea And Vomiting  ? ? ?No medications prior to admission.  ? ? ?No results found for this or any previous visit (from the past 48 hour(s)). ?No results found. ? ?ROS NO RECENT ILLNESSES OR HOSPITALIZATIONS ? ?Height '5\' 6"'$  (1.676 m), weight 82.5 kg. ?Physical Exam  ?General Appearance:  Alert, cooperative, no distress, appears stated age  ?Head:  Normocephalic, without obvious abnormality, atraumatic  ?Eyes:  Pupils equal,  conjunctiva/corneas clear,   ?   ?   ?Throat: Lips, mucosa, and tongue normal; teeth and gums normal  ?Neck: No visible masses  ?   ?Lungs:   respirations unlabored  ?Chest Wall:  No tenderness or deformity  ?Heart:  Regular rate and rhythm,  ?Abdomen:   Soft, non-tender,   ?   ?   ?Extremities: LUE: SKIN INTACT, FINGERS WARM WELL PERFUSED ?GOOD CAP REFIL.   ?Pulses: 2+ and symmetric  ?Skin: Skin color, texture, turgor normal, no rashes or lesions  ?   ?Neurologic: Normal  ? ? ? ?Assessment/Plan ?LEFT WRIST TFCC TEAR ?   - LEFT WRIST ARTHROSCOPY AND JOINT DEBRIDEMENT WITH REPAIR AS INDICATED ? ?R/B/A DISCUSSED WITH PT IN OFFICE.  ?PT VOICED UNDERSTANDING OF PLAN ?CONSENT SIGNED DAY OF SURGERY ?PT SEEN AND EXAMINED PRIOR TO OPERATIVE PROCEDURE/DAY OF SURGERY ?SITE MARKED. ?QUESTIONS ANSWERED ?WILL GO HOME FOLLOWING SURGERY  ? ?WE ARE PLANNING SURGERY FOR YOUR UPPER EXTREMITY. THE RISKS AND BENEFITS OF SURGERY INCLUDE BUT NOT LIMITED TO BLEEDING INFECTION, DAMAGE TO NEARBY NERVES ARTERIES TENDONS, FAILURE OF SURGERY TO ACCOMPLISH ITS INTENDED GOALS, PERSISTENT SYMPTOMS AND NEED FOR FURTHER SURGICAL INTERVENTION. WITH THIS IN MIND WE WILL PROCEED. I HAVE DISCUSSED WITH THE PATIENT THE PRE AND POSTOPERATIVE REGIMEN AND THE DOS AND DON'TS. PT VOICED UNDERSTANDING AND INFORMED CONSENT SIGNED.  ? ? ?Iran Planas MD 03/06/22 ? ?Deanna Gregory ?03/03/2022, 10:59 AM ? ? ?

## 2022-03-06 ENCOUNTER — Ambulatory Visit (HOSPITAL_BASED_OUTPATIENT_CLINIC_OR_DEPARTMENT_OTHER): Admitting: Anesthesiology

## 2022-03-06 ENCOUNTER — Other Ambulatory Visit: Payer: Self-pay

## 2022-03-06 ENCOUNTER — Encounter (HOSPITAL_BASED_OUTPATIENT_CLINIC_OR_DEPARTMENT_OTHER)
Admission: RE | Disposition: A | Discharge status: Home / Self Care | Payer: Self-pay | Source: Home / Self Care | Attending: Orthopedic Surgery

## 2022-03-06 ENCOUNTER — Ambulatory Visit (HOSPITAL_BASED_OUTPATIENT_CLINIC_OR_DEPARTMENT_OTHER)
Admission: RE | Admit: 2022-03-06 | Discharge: 2022-03-06 | Disposition: A | Discharge status: Home / Self Care | Attending: Orthopedic Surgery | Admitting: Orthopedic Surgery

## 2022-03-06 ENCOUNTER — Encounter (HOSPITAL_BASED_OUTPATIENT_CLINIC_OR_DEPARTMENT_OTHER): Payer: Self-pay | Admitting: Orthopedic Surgery

## 2022-03-06 ENCOUNTER — Ambulatory Visit (HOSPITAL_BASED_OUTPATIENT_CLINIC_OR_DEPARTMENT_OTHER)

## 2022-03-06 DIAGNOSIS — M1990 Unspecified osteoarthritis, unspecified site: Secondary | ICD-10-CM | POA: Insufficient documentation

## 2022-03-06 DIAGNOSIS — K219 Gastro-esophageal reflux disease without esophagitis: Secondary | ICD-10-CM | POA: Diagnosis not present

## 2022-03-06 DIAGNOSIS — E785 Hyperlipidemia, unspecified: Secondary | ICD-10-CM | POA: Insufficient documentation

## 2022-03-06 DIAGNOSIS — S63592A Other specified sprain of left wrist, initial encounter: Secondary | ICD-10-CM | POA: Insufficient documentation

## 2022-03-06 DIAGNOSIS — X58XXXA Exposure to other specified factors, initial encounter: Secondary | ICD-10-CM | POA: Diagnosis not present

## 2022-03-06 DIAGNOSIS — S63502D Unspecified sprain of left wrist, subsequent encounter: Secondary | ICD-10-CM

## 2022-03-06 DIAGNOSIS — M25532 Pain in left wrist: Secondary | ICD-10-CM | POA: Diagnosis present

## 2022-03-06 DIAGNOSIS — Z87891 Personal history of nicotine dependence: Secondary | ICD-10-CM | POA: Diagnosis not present

## 2022-03-06 HISTORY — PX: WRIST ARTHROSCOPY: SHX838

## 2022-03-06 SURGERY — ARTHROSCOPY, WRIST
Anesthesia: Regional | Site: Wrist | Laterality: Left

## 2022-03-06 MED ORDER — ACETAMINOPHEN 500 MG PO TABS
1000.0000 mg | ORAL_TABLET | Freq: Once | ORAL | Status: AC
  Administered 2022-03-06: 1000 mg via ORAL

## 2022-03-06 MED ORDER — SODIUM CHLORIDE 0.9 % IR SOLN
Status: DC | PRN
  Administered 2022-03-06: 1000 mL

## 2022-03-06 MED ORDER — FENTANYL CITRATE (PF) 100 MCG/2ML IJ SOLN
INTRAMUSCULAR | Status: AC
  Filled 2022-03-06: qty 2

## 2022-03-06 MED ORDER — ONDANSETRON HCL 4 MG/2ML IJ SOLN
INTRAMUSCULAR | Status: DC | PRN
Start: 2022-03-06 — End: 2022-03-06
  Administered 2022-03-06: 4 mg via INTRAVENOUS

## 2022-03-06 MED ORDER — CELECOXIB 200 MG PO CAPS: 200.0000 mg | ORAL_CAPSULE | Freq: Once | ORAL | Status: DC

## 2022-03-06 MED ORDER — SCOPOLAMINE 1 MG/3DAYS TD PT72
1.0000 | MEDICATED_PATCH | TRANSDERMAL | Status: DC
  Administered 2022-03-06: 1.5 mg via TRANSDERMAL

## 2022-03-06 MED ORDER — MIDAZOLAM HCL 2 MG/2ML IJ SOLN
2.0000 mg | Freq: Once | INTRAMUSCULAR | Status: AC
Start: 2022-03-06 — End: 2022-03-06
  Administered 2022-03-06: 2 mg via INTRAVENOUS

## 2022-03-06 MED ORDER — PROPOFOL 500 MG/50ML IV EMUL
INTRAVENOUS | Status: AC
  Filled 2022-03-06: qty 50

## 2022-03-06 MED ORDER — ONDANSETRON HCL 4 MG/2ML IJ SOLN
INTRAMUSCULAR | Status: AC
  Filled 2022-03-06: qty 2

## 2022-03-06 MED ORDER — MIDAZOLAM HCL 2 MG/2ML IJ SOLN
INTRAMUSCULAR | Status: AC
  Filled 2022-03-06: qty 2

## 2022-03-06 MED ORDER — FENTANYL CITRATE (PF) 100 MCG/2ML IJ SOLN: 25.0000 ug | INTRAMUSCULAR | Status: DC | PRN

## 2022-03-06 MED ORDER — ACETAMINOPHEN 500 MG PO TABS: 1000.0000 mg | ORAL_TABLET | Freq: Once | ORAL | Status: DC

## 2022-03-06 MED ORDER — PROPOFOL 10 MG/ML IV BOLUS
INTRAVENOUS | Status: AC
  Filled 2022-03-06: qty 20

## 2022-03-06 MED ORDER — SCOPOLAMINE 1 MG/3DAYS TD PT72
MEDICATED_PATCH | TRANSDERMAL | Status: AC
  Filled 2022-03-06: qty 1

## 2022-03-06 MED ORDER — CEFAZOLIN SODIUM-DEXTROSE 2-4 GM/100ML-% IV SOLN
2.0000 g | INTRAVENOUS | Status: AC
  Administered 2022-03-06: 2 g via INTRAVENOUS

## 2022-03-06 MED ORDER — MEPERIDINE HCL 25 MG/ML IJ SOLN: 6.2500 mg | INTRAMUSCULAR | Status: DC | PRN

## 2022-03-06 MED ORDER — AMISULPRIDE (ANTIEMETIC) 5 MG/2ML IV SOLN: 10.0000 mg | Freq: Once | INTRAVENOUS | Status: DC | PRN

## 2022-03-06 MED ORDER — FENTANYL CITRATE (PF) 100 MCG/2ML IJ SOLN
INTRAMUSCULAR | Status: DC | PRN
  Administered 2022-03-06: 50 ug via INTRAVENOUS

## 2022-03-06 MED ORDER — FENTANYL CITRATE (PF) 100 MCG/2ML IJ SOLN
100.0000 ug | Freq: Once | INTRAMUSCULAR | Status: AC
  Administered 2022-03-06: 100 ug via INTRAVENOUS

## 2022-03-06 MED ORDER — CELECOXIB 200 MG PO CAPS
200.0000 mg | ORAL_CAPSULE | Freq: Once | ORAL | Status: AC
  Administered 2022-03-06: 200 mg via ORAL

## 2022-03-06 MED ORDER — PROPOFOL 10 MG/ML IV BOLUS
INTRAVENOUS | Status: DC | PRN
  Administered 2022-03-06: 20 mg via INTRAVENOUS

## 2022-03-06 MED ORDER — LACTATED RINGERS IV SOLN: INTRAVENOUS | Status: DC

## 2022-03-06 MED ORDER — CEFAZOLIN SODIUM-DEXTROSE 2-4 GM/100ML-% IV SOLN
INTRAVENOUS | Status: AC
  Filled 2022-03-06: qty 100

## 2022-03-06 MED ORDER — MIDAZOLAM HCL 5 MG/5ML IJ SOLN
INTRAMUSCULAR | Status: DC | PRN
Start: 2022-03-06 — End: 2022-03-06
  Administered 2022-03-06: 2 mg via INTRAVENOUS

## 2022-03-06 MED ORDER — CLONIDINE HCL (ANALGESIA) 100 MCG/ML EP SOLN
EPIDURAL | Status: DC | PRN
  Administered 2022-03-06: 70 ug

## 2022-03-06 MED ORDER — CELECOXIB 200 MG PO CAPS
ORAL_CAPSULE | ORAL | Status: AC
  Filled 2022-03-06: qty 1

## 2022-03-06 MED ORDER — PROPOFOL 500 MG/50ML IV EMUL
INTRAVENOUS | Status: DC | PRN
  Administered 2022-03-06: 75 ug/kg/min via INTRAVENOUS

## 2022-03-06 MED ORDER — ACETAMINOPHEN 500 MG PO TABS
ORAL_TABLET | ORAL | Status: AC
  Filled 2022-03-06: qty 2

## 2022-03-06 MED ORDER — ROPIVACAINE HCL 5 MG/ML IJ SOLN
INTRAMUSCULAR | Status: DC | PRN
  Administered 2022-03-06: 40 mL via PERINEURAL

## 2022-03-06 MED ORDER — DEXAMETHASONE SODIUM PHOSPHATE 4 MG/ML IJ SOLN
INTRAMUSCULAR | Status: DC | PRN
  Administered 2022-03-06: 5 mg via PERINEURAL

## 2022-03-06 SURGICAL SUPPLY — 85 items
ANCH SUT PUSHLCK MN 8X2.5 (Orthopedic Implant) ×1 IMPLANT
ANCHOR SUT PUSHLOCK 2.5 MINI (Orthopedic Implant) ×1 IMPLANT
BLADE SURG 11 STRL SS (BLADE) ×2 IMPLANT
BLADE SURG 15 STRL LF DISP TIS (BLADE) IMPLANT
BLADE SURG 15 STRL SS (BLADE) ×2
BNDG CMPR 5X3 CHSV STRCH STRL (GAUZE/BANDAGES/DRESSINGS)
BNDG CMPR 9X4 STRL LF SNTH (GAUZE/BANDAGES/DRESSINGS) ×1
BNDG COHESIVE 3X5 TAN ST LF (GAUZE/BANDAGES/DRESSINGS) IMPLANT
BNDG CONFORM 3 STRL LF (GAUZE/BANDAGES/DRESSINGS) ×2 IMPLANT
BNDG ELASTIC 3X5.8 VLCR STR LF (GAUZE/BANDAGES/DRESSINGS) ×4 IMPLANT
BNDG ELASTIC 4X5.8 VLCR STR LF (GAUZE/BANDAGES/DRESSINGS) IMPLANT
BNDG ESMARK 4X9 LF (GAUZE/BANDAGES/DRESSINGS) ×1 IMPLANT
BNDG GAUZE ELAST 4 BULKY (GAUZE/BANDAGES/DRESSINGS) ×2 IMPLANT
CORD BIPOLAR FORCEPS 12FT (ELECTRODE) IMPLANT
COVER BACK TABLE 60X90IN (DRAPES) ×2 IMPLANT
COVER MAYO STAND STRL (DRAPES) ×2 IMPLANT
CUFF TOURN SGL QUICK 18X4 (TOURNIQUET CUFF) IMPLANT
DRAPE EXTREMITY T 121X128X90 (DISPOSABLE) ×2 IMPLANT
DRAPE OEC MINIVIEW 54X84 (DRAPES) ×1 IMPLANT
DRAPE SURG 17X23 STRL (DRAPES) ×2 IMPLANT
DRSG EMULSION OIL 3X3 NADH (GAUZE/BANDAGES/DRESSINGS) IMPLANT
GAUZE 4X4 16PLY ~~LOC~~+RFID DBL (SPONGE) IMPLANT
GAUZE XEROFORM 1X8 LF (GAUZE/BANDAGES/DRESSINGS) IMPLANT
GLOVE BIO SURGEON STRL SZ 6.5 (GLOVE) ×3 IMPLANT
GLOVE BIO SURGEON STRL SZ8 (GLOVE) ×2 IMPLANT
GLOVE BIOGEL PI IND STRL 6.5 (GLOVE) ×1 IMPLANT
GLOVE BIOGEL PI IND STRL 7.0 (GLOVE) IMPLANT
GLOVE BIOGEL PI IND STRL 8.5 (GLOVE) ×1 IMPLANT
GLOVE BIOGEL PI INDICATOR 6.5 (GLOVE) ×1
GLOVE BIOGEL PI INDICATOR 7.0 (GLOVE) ×2
GLOVE BIOGEL PI INDICATOR 8.5 (GLOVE) ×1
GOWN STRL REUS W/ TWL LRG LVL3 (GOWN DISPOSABLE) ×1 IMPLANT
GOWN STRL REUS W/ TWL XL LVL3 (GOWN DISPOSABLE) IMPLANT
GOWN STRL REUS W/TWL LRG LVL3 (GOWN DISPOSABLE) ×4
GOWN STRL REUS W/TWL XL LVL3 (GOWN DISPOSABLE) ×2
KIT MINI BIO ANCHOR DRILL (KITS) ×1 IMPLANT
NDL HYPO 25X1 1.5 SAFETY (NEEDLE) IMPLANT
NDL MAYO 6 CRC TAPER PT (NEEDLE) IMPLANT
NDL SUT 6 .5 CRC .975X.05 MAYO (NEEDLE) IMPLANT
NEEDLE HYPO 22GX1.5 SAFETY (NEEDLE) ×4 IMPLANT
NEEDLE HYPO 25X1 1.5 SAFETY (NEEDLE) IMPLANT
NEEDLE MAYO 6 CRC TAPER PT (NEEDLE) IMPLANT
NEEDLE MAYO TAPER (NEEDLE)
NS IRRIG 1000ML POUR BTL (IV SOLUTION) IMPLANT
PACK BASIN DAY SURGERY FS (CUSTOM PROCEDURE TRAY) ×2 IMPLANT
PAD CAST 3X4 CTTN HI CHSV (CAST SUPPLIES) ×2 IMPLANT
PAD CAST 4YDX4 CTTN HI CHSV (CAST SUPPLIES) IMPLANT
PADDING CAST ABS 4INX4YD NS (CAST SUPPLIES) ×1
PADDING CAST ABS COTTON 4X4 ST (CAST SUPPLIES) ×1 IMPLANT
PADDING CAST COTTON 3X4 STRL (CAST SUPPLIES) ×4
PADDING CAST COTTON 4X4 STRL (CAST SUPPLIES)
PASSER SUT SWANSON 36MM LOOP (INSTRUMENTS) IMPLANT
SET SM JOINT TUBING/CANN (CANNULA) ×2 IMPLANT
SHAVER DISSECTOR 3.0 (BURR) ×2 IMPLANT
SLEEVE SCD COMPRESS KNEE MED (STOCKING) ×1 IMPLANT
SLING ARM FOAM STRAP MED (SOFTGOODS) ×1 IMPLANT
SPIKE FLUID TRANSFER (MISCELLANEOUS) IMPLANT
SPLINT FAST PLASTER 5X30 (CAST SUPPLIES)
SPLINT FIBERGLASS 3X35 (CAST SUPPLIES) IMPLANT
SPLINT FIBERGLASS 4X30 (CAST SUPPLIES) IMPLANT
SPLINT PLASTER CAST FAST 5X30 (CAST SUPPLIES) IMPLANT
SPLINT PLASTER CAST XFAST 3X15 (CAST SUPPLIES) IMPLANT
SPLINT PLASTER XTRA FASTSET 3X (CAST SUPPLIES)
STOCKINETTE 4X48 STRL (DRAPES) ×2 IMPLANT
STOCKINETTE SYNTHETIC 3 UNSTER (CAST SUPPLIES) ×2 IMPLANT
STRIP CLOSURE SKIN 1/2X4 (GAUZE/BANDAGES/DRESSINGS) IMPLANT
SUT ETHIBOND 3-0 V-5 (SUTURE) IMPLANT
SUT FIBERWIRE 2-0 18 17.9 3/8 (SUTURE) ×2
SUT MNCRL AB 4-0 PS2 18 (SUTURE) ×2 IMPLANT
SUT PDS AB 2-0 CT2 27 (SUTURE) IMPLANT
SUT PROLENE 3 0 PS 2 (SUTURE) IMPLANT
SUT PROLENE 4 0 PS 2 18 (SUTURE) ×2 IMPLANT
SUT VIC AB 3-0 FS2 27 (SUTURE) ×2 IMPLANT
SUTURE FIBERWR 2-0 18 17.9 3/8 (SUTURE) IMPLANT
SYR BULB EAR ULCER 3OZ GRN STR (SYRINGE) IMPLANT
SYR CONTROL 10ML LL (SYRINGE) ×2 IMPLANT
TAPE SURG TRANSPORE 1 IN (GAUZE/BANDAGES/DRESSINGS) ×1 IMPLANT
TAPE SURGICAL TRANSPORE 1 IN (GAUZE/BANDAGES/DRESSINGS) ×1
TOWEL GREEN STERILE FF (TOWEL DISPOSABLE) ×4 IMPLANT
TRAP DIGIT (INSTRUMENTS) IMPLANT
TRAP FINGER LRG (INSTRUMENTS) ×2 IMPLANT
TUBE CONNECTING 20X1/4 (TUBING) ×1 IMPLANT
UNDERPAD 30X36 HEAVY ABSORB (UNDERPADS AND DIAPERS) ×2 IMPLANT
WAND 1.5 MICROBLATOR (SURGICAL WAND) ×2 IMPLANT
WATER STERILE IRR 1000ML POUR (IV SOLUTION) ×2 IMPLANT

## 2022-03-06 NOTE — Anesthesia Procedure Notes (Signed)
Procedure Name: [place] ?Date/Time: 03/06/2022 1:53 PM ?Performed by: Glory Buff, CRNA ?Oxygen Delivery Method: Simple face mask ? ? ? ? ?

## 2022-03-06 NOTE — Discharge Instructions (Addendum)
KEEP BANDAGE CLEAN AND DRY ?CALL OFFICE FOR F/U APPT [phone] IN 2 WEEKS ?KEEP HAND ELEVATED ABOVE HEART ?OK TO APPLY ICE TO OPERATIVE AREA ?CONTACT OFFICE IF ANY WORSENING PAIN OR CONCERNS.  ?Regional Anesthesia Blocks ? ?1. Numbness or the inability to move the "blocked" extremity may last from 3-48 hours after placement. The length of time depends on the medication injected and your individual response to the medication. If the numbness is not going away after 48 hours, call your surgeon. ? ?2. The extremity that is blocked will need to be protected until the numbness is gone and the  Strength has returned. Because you cannot feel it, you will need to take extra care to avoid injury. Because it may be weak, you may have difficulty moving it or using it. You may not know what position it is in without looking at it while the block is in effect. ? ?3. For blocks in the legs and feet, returning to weight bearing and walking needs to be done carefully. You will need to wait until the numbness is entirely gone and the strength has returned. You should be able to move your leg and foot normally before you try and bear weight or walk. You will need someone to be with you when you first try to ensure you do not fall and possibly risk injury. ? ?4. Bruising and tenderness at the needle site are common side effects and will resolve in a few days. ? ?5. Persistent numbness or new problems with movement should be communicated to the surgeon or the [facility] [phone] [place] [phone]).  ? ?Post Anesthesia Home Care Instructions ? ?Activity: ?Get plenty of rest for the remainder of the day. A responsible individual must stay with you for 24 hours following the procedure.  ?For the next 24 hours, DO NOT: ?-Drive a car ?-Paediatric nurse ?-Drink alcoholic beverages ?-Take any medication unless instructed by your physician ?-Make any legal decisions or sign important  papers. ? ?Meals: ?Start with liquid foods such as gelatin or soup. Progress to regular foods as tolerated. Avoid greasy, spicy, heavy foods. If nausea and/or vomiting occur, drink only clear liquids until the nausea and/or vomiting subsides. Call your physician if vomiting continues. ? ?Special Instructions/Symptoms: ?Your throat may feel dry or sore from the anesthesia or the breathing tube placed in your throat during surgery. If this causes discomfort, gargle with warm salt water. The discomfort should disappear within 24 hours. ? ?If you had a scopolamine patch placed behind your ear for the management of post- operative nausea and/or vomiting: ? ?1. The medication in the patch is effective for 72 hours, after which it should be removed.  Wrap patch in a tissue and discard in the trash. Wash hands thoroughly with soap and water. ?2. You may remove the patch earlier than 72 hours if you experience unpleasant side effects which may include dry mouth, dizziness or visual disturbances. ?3. Avoid touching the patch. Wash your hands with soap and water after contact with the patch. ?    ?

## 2022-03-06 NOTE — Transfer of Care (Signed)
Immediate Anesthesia Transfer of Care Note ? ?Patient: Deanna Gregory ? ?Procedure(s) Performed: left wrist arthroscopy and joint debridement and repair as indicated (Left: Wrist) ? ?Patient Location: PACU ? ?Anesthesia Type:MAC ? ?Level of Consciousness: drowsy and patient cooperative ? ?Airway & Oxygen Therapy: Patient Spontanous Breathing and Patient connected to face mask oxygen ? ?Post-op Assessment: Report given to RN and Post -op Vital signs reviewed and stable ? ?Post vital signs: Reviewed and stable ? ?Last Vitals:  ?Vitals Value Taken Time  ?BP    ?Temp    ?Pulse 57 03/06/22 1527  ?Resp    ?SpO2 98 % 03/06/22 1527  ?Vitals shown include unvalidated device data. ? ?Last Pain:  ?Vitals:  ? 03/06/22 1240  ?TempSrc: Oral  ?PainSc: 0-No pain  ?   ? ?Patients Stated Pain Goal: 10 (03/06/22 1240) ? ?Complications: No notable events documented. ?

## 2022-03-06 NOTE — Anesthesia Postprocedure Evaluation (Signed)
Anesthesia Post Note ? ?Patient: Deanna Gregory ? ?Procedure(s) Performed: left wrist arthroscopy and joint debridement and repair as indicated (Left: Wrist) ? ?  ? ?Patient location during evaluation: PACU ?Anesthesia Type: Regional ?Level of consciousness: sedated and patient cooperative ?Pain management: pain level controlled ?Vital Signs Assessment: post-procedure vital signs reviewed and stable ?Respiratory status: spontaneous breathing ?Cardiovascular status: stable ?Anesthetic complications: no ? ? ?No notable events documented. ? ?Last Vitals:  ?Vitals:  ? 03/06/22 1600 03/06/22 1615  ?BP:  104/74  ?Pulse: (!) 57 (!) 58  ?Resp: 16 16  ?Temp:  36.7 ?C  ?SpO2: 95% 93%  ?  ?Last Pain:  ?Vitals:  ? 03/06/22 1615  ?TempSrc:   ?PainSc: 0-No pain  ? ? ?  ?  ?  ?  ?  ?  ? ?Nolon Nations ? ? ? ? ?

## 2022-03-06 NOTE — Progress Notes (Signed)
Assisted Dr. Germeroth with left, axillary, ultrasound guided block. Side rails up, monitors on throughout procedure. See vital signs in flow sheet. Tolerated Procedure well. 

## 2022-03-06 NOTE — Op Note (Signed)
PREOPERATIVE DIAGNOSIS: Left wrist peripheral TFCC tear ?  ?POSTOPERATIVE DIAGNOSIS: Same ?  ?ATTENDING SURGEON: Dr. Iran Planas who is scrubbed and present for the entire procedure ?  ?ASSISTANT SURGEON: Gertie Fey, [facility] who scrubbed in necessary for arthroscopic debridement and open TFCC repair closure and splinting in a timely fashion ?  ?ANESTHESIA: Regional with IV sedation ?  ?OPERATIVE PROCEDURE: Left wrist arthroscopic TFCC debridement and radiocarpal arthroscopy ?Left wrist open capsulorrhaphy, peripheral TFCC repair ligamentous repair ?  ?IMPLANTS: Arthrex 2.5 push lock anchor ?  ?EBL: Minimal ?  ?RADIOGRAPHIC INTERPRETATION: None ?  ?SURGICAL INDICATIONS: Patient is a right-hand-dominant female who had the persistent left ulnar-sided wrist pain.  Patient elected undergo the above procedure.  The risks of surgery include but not limited to bleeding infection damage nearby nerves arteries or tendons loss of motion of the wrist and digits incomplete relief of symptoms and need for further surgical invention.  A signed informed consent was obtained on the day of surgery. ?  ?SURGICAL TECHNIQUE: The patient was prepped identified in the preoperative holding area marked apart a marker made on the left wrist indicate correct operative site.  The patient then brought back operating placed supine on the anesthesia table where the regional anesthetic was administered.  Preoperative antibiotics were given prior to skin incision.  A well-padded tourniquet placed on the left brachium and seal with the appropriate drape.  The left upper extremities then prepped and draped in normal sterile fashion.  A timeout was called the correct site was identified procedure then begun.  Attention was then turned to the left wrist the limb was then placed in the [employer] wrist arthroscopy tower.  Proximally 5 to 10 pounds of countertraction applied across the radiocarpal midcarpal joint.  10 cc of saline solution was then  infiltrated into the joint.  3 4 working portal was established.  The joint was entered into surgically.  Following this a 6R was then established and outflow was obtained with a 22-gauge needle.  The patient did have good continuity of the radial scaphoid capitate short and long radiolunate ligaments as well as the scapholunate interosseous ligament.  Further inspection was seen carried out ulnarly with the patient did have the fraying around the central margins of the TFCC but also had the tear of the peripheral margin of the TFCC.  Probing was able to be done underneath the peripheral margins and this was unstable.  Following this further arthroscopic evaluation was then carried out and the patient did have some chondral grade 1-2 changes at the lunotriquetral ligamentous region.  This was a very small region of the lunate.  The small suction shaver was then introduced and debridement was then carried out around the central and peripheral fraying of the TFCC as an chondroplasty was also carried out of the loose cartilaginous fragments along the LT region.  Probing of the LT ligament was done was unable to pass the probe through the LT ligament region.  The instrumentation was then reversed and further inspection of the lunotriquetral ligament as well as peripheral margin the TFCC was then done.  Following this midcarpal arthroscopy was done.  Camera was placed in the midcarpal joint.  Did not appear to be any irregularities within the capitate or lunate surfaces.  No evidence of loose body noted or irregularities within the midcarpal joint.  Given the instability of probing of the TFCC open ligamentous repair was then undertaken.  The arthroscopic instrumentation was then removed.  A longitudinal incision  made directly over the region between the ECU and FCU.  The tourniquet insufflated.  Dissection carried down through the skin and subcutaneous tissue.  The retinaculum was marked and incised longitudinally  careful protection distally was done of the ulnar sensory nerve.  Following this the ECU tendon was carefully protected.  Capsulotomy was then done in the TFCC region was then opened up and inspected.  The patient did have the peripheral tear well visualized.  Once this was carried out 2-0 FiberWire suture was then placed in a horizontal mattress through the tear and then back out.  The 2 limbs of the suture were then tagged for later.  Proximally 1 cm proximal to the ulnar styloid appropriate drill bit was then used for the push lock anchor.  The limbs of the suture were then crossed across the ulnar styloid and then brought down nicely down to the bone and the construct was then completed by gently tapping the anchor in place to secure this very nicely.  Probing of the TFCC was then done through the capsulotomy felt to be of good stability.  Peripheral sutures were then placed 2-0 Vicryl suture along the margins of the periphery.  Following this the arthrotomy was then closed with 2-0 Vicryl suture.  Careful repair of the retinaculum was then done with 2-0 Vicryl suture.  The subcutaneous tissues were then closed with 3-0 Monocryl and skin closed with a running 4-0 Prolene.  The arthroscopic instrumentation was then placed back into the joint and final images and probing of the TFCC was then done following repair there was good visualization of the repair without any instability.  The joint was then irrigated.  The portal sites were then closed with simple Prolene sutures.  Steri-Strips were applied over the ulnar incision.  Sterile compressive bandage then applied.  The patient was placed in a well-padded sugar-tong splint taken recovery in good condition ?  ?POSTOPERATIVE PLAN: Patient be discharged to home.  See him back in the office in 2 weeks for wound check suture removal application of a long-arm cast for total of 4 weeks short arm cast for total of 2 additional weeks then begin a therapy regimen at the  6-week mark.  No radiographs at the first visit.  Begin working on gentle mobility strength and conditioning from the 6-week mark to approximate the 4 to 64-monthmark. ?  ?Prognosis: The patient did have some mild chondral changes noted within the lunate and the TFCC tear hopefully the repair will provide her with some stability and diminish her pain.  If ulnar-sided discomfort persists 1 may consider given the chondral changes noted within the lunate and offloading procedure on the ulnar aspect of the wrist. ?

## 2022-03-06 NOTE — Anesthesia Preprocedure Evaluation (Addendum)
Anesthesia Evaluation  ?Patient identified by MRN, date of birth, ID band ?Patient awake ? ? ? ?Reviewed: ?Allergy & Precautions, NPO status , Patient's Chart, lab work & pertinent test results ? ?History of Anesthesia Complications ?(+) PONV and history of anesthetic complications ? ?Airway ?Mallampati: I ? ?TM Distance: >3 FB ?Neck ROM: Full ? ? ? Dental ? ?(+) Teeth Intact, Dental Advisory Given ?  ?Pulmonary ?former smoker,  ?  ?breath sounds clear to auscultation ? ? ? ? ? ? Cardiovascular ?negative cardio ROS ? ? ?Rhythm:Regular Rate:Normal ? ? ?  ?Neuro/Psych ?negative neurological ROS ? negative psych ROS  ? GI/Hepatic ?Neg liver ROS, GERD  ,  ?Endo/Other  ?negative endocrine ROS ? Renal/GU ?negative Renal ROS  ? ?  ?Musculoskeletal ? ?(+) Arthritis ,  ? Abdominal ?Normal abdominal exam  (+)   ?Peds ? Hematology ?negative hematology ROS ?(+)   ?Anesthesia Other Findings ? ? Reproductive/Obstetrics ? ?  ? ? ? ? ? ? ? ? ? ? ? ? ? ?  ?  ? ? ? ? ? ? ? ? ?Anesthesia Physical ? ?Anesthesia Plan ? ?ASA: 2 ? ?Anesthesia Plan: Regional  ? ?Post-op Pain Management: Regional block  ? ?Induction: Intravenous ? ?PONV Risk Score and Plan: 4 or greater and Ondansetron, Dexamethasone, Propofol infusion, Midazolam, Scopolamine patch - Pre-op and Treatment may vary due to age or medical condition ? ?Airway Management Planned: Natural Airway and Simple Face Mask ? ?Additional Equipment: None ? ?Intra-op Plan:  ? ?Post-operative Plan:  ? ?Informed Consent: I have reviewed the patients History and Physical, chart, labs and discussed the procedure including the risks, benefits and alternatives for the proposed anesthesia with the patient or authorized representative who has indicated his/her understanding and acceptance.  ? ? ? ?Dental advisory given ? ?Plan Discussed with: CRNA ? ?Anesthesia Plan Comments:   ? ? ? ? ? ? ?Anesthesia Quick Evaluation ? ?

## 2022-03-06 NOTE — Anesthesia Procedure Notes (Signed)
Anesthesia Regional Block: Axillary brachial plexus block  ? ?Pre-Anesthetic Checklist: , timeout performed,  Correct Patient, Correct Site, Correct Laterality,  Correct Procedure, Correct Position, site marked,  Risks and benefits discussed,  Surgical consent,  Pre-op evaluation,  At surgeon's request and post-op pain management ? ?Laterality: Upper and Left ? ?Prep: chloraprep     ?  ?Needles:  ?Injection technique: Single-shot ? ?Needle Type: Stimiplex   ? ? ? ? ? ? ? ?Additional Needles: ? ? ?Procedures:,,,, ultrasound used (permanent image in chart),,    ?Narrative:  ?Start time: 03/06/2022 1:16 PM ?End time: 03/06/2022 1:37 PM ?Injection made incrementally with aspirations every 5 mL. ? ?Performed by: Personally  ?Anesthesiologist: Nolon Nations, MD ? ?Additional Notes: ?BP cuff, SpO2 and EKG monitors applied. Sedation begun. Nerve location verified with ultrasound. Anesthetic injected incrementally, slowly, and after neg aspirations under direct u/s guidance. Good perineural spread. Tolerated well. ? ? ? ? ?

## 2022-03-09 ENCOUNTER — Encounter (HOSPITAL_BASED_OUTPATIENT_CLINIC_OR_DEPARTMENT_OTHER): Payer: Self-pay | Admitting: Orthopedic Surgery

## 2022-12-19 ENCOUNTER — Ambulatory Visit: Admitting: Dermatology
